# Patient Record
Sex: Female | Born: 1965 | Race: White | Hispanic: No | Marital: Married | Smoking: Never smoker
Health system: Southern US, Community
[De-identification: ages and names within clinical notes are randomized; demographics above are authoritative.]

## PROBLEM LIST (undated history)

## (undated) DIAGNOSIS — E079 Disorder of thyroid, unspecified: Secondary | ICD-10-CM

## (undated) DIAGNOSIS — F329 Major depressive disorder, single episode, unspecified: Secondary | ICD-10-CM

## (undated) DIAGNOSIS — F32A Depression, unspecified: Secondary | ICD-10-CM

## (undated) HISTORY — DX: Major depressive disorder, single episode, unspecified: F32.9

## (undated) HISTORY — PX: LEFT OOPHORECTOMY: SHX1961

## (undated) HISTORY — PX: TUBAL LIGATION: SHX77

## (undated) HISTORY — PX: ECTOPIC PREGNANCY SURGERY: SHX613

## (undated) HISTORY — DX: Depression, unspecified: F32.A

## (undated) HISTORY — DX: Disorder of thyroid, unspecified: E07.9

## (undated) HISTORY — PX: WISDOM TOOTH EXTRACTION: SHX21

---

## 2005-01-08 ENCOUNTER — Ambulatory Visit (HOSPITAL_COMMUNITY): Admission: RE | Admit: 2005-01-08 | Discharge: 2005-01-08 | Payer: Self-pay | Admitting: Gynecology

## 2005-07-19 ENCOUNTER — Ambulatory Visit: Payer: Self-pay | Admitting: Gynecology

## 2005-08-17 ENCOUNTER — Ambulatory Visit: Payer: Self-pay | Admitting: Family Medicine

## 2005-08-17 ENCOUNTER — Ambulatory Visit (HOSPITAL_COMMUNITY): Admission: RE | Admit: 2005-08-17 | Discharge: 2005-08-17 | Payer: Self-pay | Admitting: Gynecology

## 2005-09-14 ENCOUNTER — Ambulatory Visit: Payer: Self-pay | Admitting: Family Medicine

## 2005-09-30 ENCOUNTER — Ambulatory Visit: Payer: Self-pay | Admitting: Obstetrics & Gynecology

## 2005-10-18 ENCOUNTER — Ambulatory Visit: Payer: Self-pay | Admitting: Gynecology

## 2005-11-01 ENCOUNTER — Ambulatory Visit: Payer: Self-pay | Admitting: Gynecology

## 2005-11-15 ENCOUNTER — Ambulatory Visit: Payer: Self-pay | Admitting: Gynecology

## 2005-11-29 ENCOUNTER — Ambulatory Visit: Payer: Self-pay | Admitting: Gynecology

## 2005-12-06 ENCOUNTER — Ambulatory Visit: Payer: Self-pay | Admitting: Gynecology

## 2005-12-14 ENCOUNTER — Ambulatory Visit: Payer: Self-pay | Admitting: Family Medicine

## 2005-12-20 ENCOUNTER — Ambulatory Visit: Payer: Self-pay | Admitting: Gynecology

## 2005-12-27 ENCOUNTER — Ambulatory Visit: Payer: Self-pay | Admitting: Gynecology

## 2006-01-03 ENCOUNTER — Ambulatory Visit: Payer: Self-pay | Admitting: Gynecology

## 2006-01-03 ENCOUNTER — Inpatient Hospital Stay (HOSPITAL_COMMUNITY): Admission: AD | Admit: 2006-01-03 | Discharge: 2006-01-05 | Payer: Self-pay | Admitting: Gynecology

## 2006-03-07 ENCOUNTER — Ambulatory Visit: Payer: Self-pay | Admitting: Gynecology

## 2006-12-21 ENCOUNTER — Ambulatory Visit: Payer: Self-pay | Admitting: Family Medicine

## 2008-08-29 ENCOUNTER — Ambulatory Visit: Payer: Self-pay | Admitting: Internal Medicine

## 2009-01-07 ENCOUNTER — Ambulatory Visit: Payer: Self-pay

## 2010-03-16 ENCOUNTER — Ambulatory Visit: Payer: Self-pay | Admitting: Internal Medicine

## 2012-05-03 ENCOUNTER — Ambulatory Visit (INDEPENDENT_AMBULATORY_CARE_PROVIDER_SITE_OTHER): Payer: Self-pay | Admitting: Obstetrics & Gynecology

## 2012-05-03 ENCOUNTER — Encounter: Payer: Self-pay | Admitting: Obstetrics & Gynecology

## 2012-05-03 VITALS — BP 131/91 | HR 74 | Ht 70.0 in | Wt 255.0 lb

## 2012-05-03 DIAGNOSIS — N888 Other specified noninflammatory disorders of cervix uteri: Secondary | ICD-10-CM

## 2012-05-03 DIAGNOSIS — N72 Inflammatory disease of cervix uteri: Secondary | ICD-10-CM

## 2012-05-03 NOTE — Patient Instructions (Signed)
Return to clinic for any scheduled appointments or for any gynecologic concerns as needed.   

## 2012-05-03 NOTE — Progress Notes (Signed)
History:  47 y.o. [id number] here today after referral by her PCP for evaluation of cervical cysts and vaginal "lymph-node like" mass palpated during her recent physical exam. Patient denies any symptoms.  The pap smear done during that visit was negative.  The following portions of the patient's history were reviewed and updated as appropriate: allergies, current medications, past family history, past medical history, past social history, past surgical history and problem list.  Review of Systems:  Pertinent items are noted in HPI.  Objective:  Physical Exam BP 131/91  Pulse 74  Ht 5\' 10"  (1.778 m)  Wt 255 lb (115.667 kg)  BMI 36.59 kg/m2  LMP 04/16/2012 Gen: NAD Abd: Soft, nontender and nondistended Pelvic: Normal appearing external genitalia; normal appearing vaginal mucosa.  Two Nabothian cysts noted on posterior lip of cervix, normal appearance.  No abnormal masses palpated or visualized in vagina. Normal discharge.  Small uterus, no other palpable masses, no uterine or adnexal tenderness  Assessment & Plan:  Normal examination, patient reassured about benign nature of Nabothian cysts. Return to clinic for any gynecologic concerns as needed.

## 2012-05-24 ENCOUNTER — Ambulatory Visit: Payer: Self-pay

## 2013-03-25 ENCOUNTER — Inpatient Hospital Stay: Payer: Self-pay | Admitting: Internal Medicine

## 2013-03-25 ENCOUNTER — Ambulatory Visit: Payer: Self-pay | Admitting: Neurology

## 2013-03-25 LAB — URINALYSIS, COMPLETE
Blood: NEGATIVE
Glucose,UR: NEGATIVE mg/dL (ref 0–75)
Ketone: NEGATIVE
Specific Gravity: 1.003 (ref 1.003–1.030)
Squamous Epithelial: 1

## 2013-03-25 LAB — CBC
HGB: 12.9 g/dL (ref 12.0–16.0)
MCV: 81 fL (ref 80–100)
Platelet: 254 10*3/uL (ref 150–440)
RBC: 4.88 10*6/uL (ref 3.80–5.20)

## 2013-03-25 LAB — COMPREHENSIVE METABOLIC PANEL
Chloride: 105 mmol/L (ref 98–107)
Co2: 31 mmol/L (ref 21–32)
EGFR (Non-African Amer.): >60
Glucose: 100 mg/dL — ABNORMAL HIGH (ref 65–99)
Osmolality: 278 (ref 275–301)
SGOT(AST): 18 U/L (ref 15–37)
SGPT (ALT): 14 U/L (ref 12–78)
Total Protein: 7.7 g/dL (ref 6.4–8.2)

## 2013-03-26 LAB — CBC WITH DIFFERENTIAL/PLATELET
Eosinophil #: 0.2 10*3/uL (ref 0.0–0.7)
HGB: 13.1 g/dL (ref 12.0–16.0)
Lymphocyte %: 24.4 %
MCH: 27.1 pg (ref 26.0–34.0)
Monocyte #: 0.4 x10 3/mm (ref 0.2–0.9)
Neutrophil #: 6.6 10*3/uL — ABNORMAL HIGH (ref 1.4–6.5)
Neutrophil %: 68.8 %
Platelet: 257 10*3/uL (ref 150–440)
RBC: 4.83 10*6/uL (ref 3.80–5.20)
WBC: 9.6 10*3/uL (ref 3.6–11.0)

## 2013-03-26 LAB — LIPID PANEL
HDL Cholesterol: 44 mg/dL (ref 40–60)
Triglycerides: 102 mg/dL (ref 0–200)
VLDL Cholesterol, Calc: 20 mg/dL (ref 5–40)

## 2013-03-26 LAB — HEMOGLOBIN A1C: Hemoglobin A1C: 5.6 % (ref 4.2–6.3)

## 2013-03-26 LAB — COMPREHENSIVE METABOLIC PANEL
Albumin: 3 g/dL — ABNORMAL LOW (ref 3.4–5.0)
Alkaline Phosphatase: 69 U/L
Bilirubin,Total: 0.3 mg/dL (ref 0.2–1.0)
Calcium, Total: 8.7 mg/dL (ref 8.5–10.1)
Creatinine: 0.77 mg/dL (ref 0.60–1.30)
SGOT(AST): 17 U/L (ref 15–37)
SGPT (ALT): 14 U/L (ref 12–78)
Total Protein: 7.3 g/dL (ref 6.4–8.2)

## 2013-03-27 ENCOUNTER — Ambulatory Visit (HOSPITAL_COMMUNITY)
Admission: AD | Admit: 2013-03-27 | Discharge: 2013-03-27 | Disposition: A | Discharge status: Home / Self Care | Payer: Self-pay | Source: Other Acute Inpatient Hospital | Attending: Internal Medicine | Admitting: Internal Medicine

## 2013-05-12 ENCOUNTER — Inpatient Hospital Stay: Payer: Self-pay | Admitting: Internal Medicine

## 2013-05-12 LAB — COMPREHENSIVE METABOLIC PANEL
ALK PHOS: 63 U/L
Albumin: 3.4 g/dL (ref 3.4–5.0)
Anion Gap: 5 — ABNORMAL LOW (ref 7–16)
BILIRUBIN TOTAL: 0.5 mg/dL (ref 0.2–1.0)
BUN: 17 mg/dL (ref 7–18)
CREATININE: 0.84 mg/dL (ref 0.60–1.30)
Calcium, Total: 9.5 mg/dL (ref 8.5–10.1)
Chloride: 100 mmol/L (ref 98–107)
Co2: 26 mmol/L (ref 21–32)
GLUCOSE: 160 mg/dL — AB (ref 65–99)
Osmolality: 268 (ref 275–301)
POTASSIUM: 3.9 mmol/L (ref 3.5–5.1)
SGOT(AST): 20 U/L (ref 15–37)
SGPT (ALT): 14 U/L (ref 12–78)
Sodium: 131 mmol/L — ABNORMAL LOW (ref 136–145)
Total Protein: 8.3 g/dL — ABNORMAL HIGH (ref 6.4–8.2)

## 2013-05-12 LAB — HCG, QUANTITATIVE, PREGNANCY

## 2013-05-12 LAB — CBC
HCT: 38.8 % (ref 35.0–47.0)
HGB: 12.4 g/dL (ref 12.0–16.0)
MCH: 26.5 pg (ref 26.0–34.0)
MCHC: 32 g/dL (ref 32.0–36.0)
MCV: 83 fL (ref 80–100)
Platelet: 244 10*3/uL (ref 150–440)
RBC: 4.68 10*6/uL (ref 3.80–5.20)
RDW: 13.4 % (ref 11.5–14.5)
WBC: 14.3 10*3/uL — ABNORMAL HIGH (ref 3.6–11.0)

## 2013-05-12 LAB — TROPONIN I: Troponin-I: <0.02 ng/mL

## 2013-05-12 LAB — RAPID INFLUENZA A&B ANTIGENS

## 2013-05-13 LAB — CBC WITH DIFFERENTIAL/PLATELET
BASOS ABS: 0 10*3/uL (ref 0.0–0.1)
Basophil %: 0.3 %
EOS PCT: 1.8 %
Eosinophil #: 0.2 10*3/uL (ref 0.0–0.7)
HCT: 34.1 % — AB (ref 35.0–47.0)
HGB: 11.2 g/dL — ABNORMAL LOW (ref 12.0–16.0)
LYMPHS ABS: 1.5 10*3/uL (ref 1.0–3.6)
LYMPHS PCT: 18.4 %
MCH: 27 pg (ref 26.0–34.0)
MCHC: 32.7 g/dL (ref 32.0–36.0)
MCV: 82 fL (ref 80–100)
Monocyte #: 0.6 x10 3/mm (ref 0.2–0.9)
Monocyte %: 7.5 %
NEUTROS ABS: 6 10*3/uL (ref 1.4–6.5)
Neutrophil %: 72 %
Platelet: 219 10*3/uL (ref 150–440)
RBC: 4.14 10*6/uL (ref 3.80–5.20)
RDW: 13.8 % (ref 11.5–14.5)
WBC: 8.4 10*3/uL (ref 3.6–11.0)

## 2013-05-13 LAB — BASIC METABOLIC PANEL
Anion Gap: 3 — ABNORMAL LOW (ref 7–16)
BUN: 11 mg/dL (ref 7–18)
CALCIUM: 8.4 mg/dL — AB (ref 8.5–10.1)
CHLORIDE: 105 mmol/L (ref 98–107)
CO2: 28 mmol/L (ref 21–32)
CREATININE: 0.86 mg/dL (ref 0.60–1.30)
EGFR (African American): >60
Glucose: 108 mg/dL — ABNORMAL HIGH (ref 65–99)
OSMOLALITY: 272 (ref 275–301)
Potassium: 3.5 mmol/L (ref 3.5–5.1)
SODIUM: 136 mmol/L (ref 136–145)

## 2013-05-13 LAB — LIPID PANEL
Cholesterol: 86 mg/dL (ref 0–200)
HDL Cholesterol: 51 mg/dL (ref 40–60)
LDL CHOLESTEROL, CALC: 24 mg/dL (ref 0–100)
Triglycerides: 54 mg/dL (ref 0–200)
VLDL Cholesterol, Calc: 11 mg/dL (ref 5–40)

## 2013-05-13 LAB — HEMOGLOBIN A1C: Hemoglobin A1C: 5.4 % (ref 4.2–6.3)

## 2013-05-13 LAB — TSH: THYROID STIMULATING HORM: 0.56 u[IU]/mL

## 2013-05-14 LAB — CBC WITH DIFFERENTIAL/PLATELET
BASOS PCT: 0.4 %
Basophil #: 0 10*3/uL (ref 0.0–0.1)
EOS ABS: 0.2 10*3/uL (ref 0.0–0.7)
EOS PCT: 2 %
HCT: 36.1 % (ref 35.0–47.0)
HGB: 11.8 g/dL — AB (ref 12.0–16.0)
LYMPHS ABS: 1.6 10*3/uL (ref 1.0–3.6)
LYMPHS PCT: 15.1 %
MCH: 27.2 pg (ref 26.0–34.0)
MCHC: 32.7 g/dL (ref 32.0–36.0)
MCV: 83 fL (ref 80–100)
MONO ABS: 0.7 x10 3/mm (ref 0.2–0.9)
Monocyte %: 6.7 %
Neutrophil #: 7.9 10*3/uL — ABNORMAL HIGH (ref 1.4–6.5)
Neutrophil %: 75.8 %
Platelet: 245 10*3/uL (ref 150–440)
RBC: 4.35 10*6/uL (ref 3.80–5.20)
RDW: 13.4 % (ref 11.5–14.5)
WBC: 10.4 10*3/uL (ref 3.6–11.0)

## 2013-05-14 LAB — BASIC METABOLIC PANEL
Anion Gap: 3 — ABNORMAL LOW (ref 7–16)
BUN: 9 mg/dL (ref 7–18)
CHLORIDE: 101 mmol/L (ref 98–107)
CO2: 30 mmol/L (ref 21–32)
Calcium, Total: 8.7 mg/dL (ref 8.5–10.1)
Creatinine: 0.89 mg/dL (ref 0.60–1.30)
GLUCOSE: 115 mg/dL — AB (ref 65–99)
OSMOLALITY: 268 (ref 275–301)
Potassium: 3.5 mmol/L (ref 3.5–5.1)
SODIUM: 134 mmol/L — AB (ref 136–145)

## 2013-05-14 LAB — TROPONIN I: Troponin-I: <0.02 ng/mL

## 2013-05-14 LAB — VANCOMYCIN, TROUGH: Vancomycin, Trough: 13 ug/mL (ref 10–20)

## 2013-05-15 LAB — VANCOMYCIN, TROUGH: Vancomycin, Trough: 12 ug/mL (ref 10–20)

## 2013-05-16 LAB — VANCOMYCIN, TROUGH: VANCOMYCIN, TROUGH: 11 ug/mL (ref 10–20)

## 2013-05-17 LAB — CULTURE, BLOOD (SINGLE)

## 2013-05-31 ENCOUNTER — Ambulatory Visit: Payer: Self-pay | Admitting: Internal Medicine

## 2014-02-11 ENCOUNTER — Encounter: Payer: Self-pay | Admitting: Obstetrics & Gynecology

## 2014-08-02 NOTE — Discharge Summary (Signed)
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 The patient does have right-sided weakness and dysarthria.   CURRENT MEDICATIONS: Include heparin drip, Tylenol 650 mg every 4 hours as needed for pain, aspirin 81 mg daily, NovoLog sliding scale, Ativan 0.5 mg every 4 hours as needed for anxiety, Zofran 4 mg IV push q.4 hours p.Aguirre.n. nausea and vomiting, Lipitor 20 mg at bedtime.   HOSPITAL COURSE: The patient was admitted December14, 2014, and discharged March 26, 2013, to [facility]. May end up going on December 16. The patient came in with slurred speech, weakness on the right side, admitted with presumed stroke.   LABORATORY AND RADIOLOGICAL DATA DURING THE HOSPITAL COURSE: Included an EKG that showed normal sinus rhythm, no acute ST-T-wave changes.   CHEST X-RAY: No active disease.   CT scan of the head: Area of chronic lacunar infarction within the left frontal lobe. Small-vessel white matter ischemic changes, focal area of high attenuation within the basal artery.   PTT 34.8. Glucose 100, BUN 15, creatinine 0.84, sodium 139, potassium 3.7, chloride 105, CO2 31, calcium 8.6. Liver function tests normal range. Albumin low at 3.2. White blood cell count 7.3, H and H 12.9 and 39.5, platelet count of 254. Beta-2 glycoprotein pending. Homocystine pending. Anticardiolipin antibodies pending.   Urinalysis: Trace leukocyte esterase. Hemoglobin A1c 5.6. LDL 75, HDL 44, triglycerides 102.   MRA of the brain without contrast showed some irregularity of the appearance of the right middle cerebral artery at the bifurcation region. Could suggest presence of a nonocclusive thrombus.   MRI of the brain with and without contrast showed scattered areas of acute infarction of  the left middle cerebral artery territory affecting the insular deep white matter and parietal cortical and subcortical brain consistent with MCA territory embolic infarctions.   Echocardiogram showed EF 50% to 55%, moderate-sized fixed mass attached to the fossa ovalis, atrial septum, and of left atrium.   Carotid ultrasound: No evidence of focal hemodynamically significant carotid stenosis.   The patient was seen in consultation by Dr. Raynelle Charyobert Hendry, neurology; and then Dr. Malvin JohnsPotter, following; Dr. Lady GaryFath, cardiology.   HOSPITAL COURSE: The patient was initially started on aspirin for stroke-like symptoms since the patient does have embolic CVA and a mass in the atrium of the heart. Dr. Malvin JohnsPotter did recommend anticoagulation since the patient could be an operative candidate with a myxoma, and they recommended heparin instead of Coumadin at this time. Heparin drip was started without a bolus. Further management by the specialists team including cardiothoracic surgery, neurology, and cardiology will be needed at [facility].   The patient was started on Lipitor, anticardiolipin antibodies pending, antithrombin III pending, beta-2 glycoprotein pending, factor II mutation analysis pending, factor V Leiden pending, and the lupus anticoagulant pending. Protein S and C panel also pending.   TIME SPENT ON PATIENT CARE TODAY: 60 minutes.    ____________________________ Herschell Dimesichard J. Renae GlossWieting, MD rjw:np D: 03/26/2013 16:16:04 ET T: 03/26/2013 16:37:47 ET JOB#: [id number]  cc: Herschell Dimesichard J. Renae GlossWieting, MD, <Dictator> Hyman HopesHarriett P. Burns, MD Dr. __________, [facility] Salley ScarletICHARD J Pablo Stauffer MD ELECTRONICALLY SIGNED 03/30/2013 17:00

## 2014-08-02 NOTE — Discharge Summary (Signed)
PATIENT NAME:  Misty Aguirre, Misty Aguirre MR#:  [id number] DATE OF BIRTH:  Apr 18, 1965  DATE OF ADMISSION:  03/25/2013 DATE OF DISCHARGE:  03/26/2013  ADDENDUM  I just got a call from Dr. Lady GaryFath who [facility]. They decided against the heparin drip at this point in time. The patient will just go over on aspirin and will be further evaluated there. ____________________________ Herschell Dimesichard J. Renae GlossWieting, MD rjw:sb D: 03/26/2013 16:19:05 ET T: 03/26/2013 16:32:45 ET JOB#: [id number]  cc: Herschell Dimesichard J. Renae GlossWieting, MD, <Dictator> Salley ScarletICHARD J Winnie Barsky MD ELECTRONICALLY SIGNED 03/30/2013 17:00

## 2014-08-02 NOTE — Consult Note (Signed)
Brief Consult Note: Diagnosis: right arm and leg weakness with dysarthria.   Patient was seen by consultant.   Recommend further assessment or treatment.   Discussed with Attending MD.   Comments: 49 yo female with no prior cardiac or medical history who was admitted after developing acute onset of right sided weakness and dysarthria. Head ct revealed frontal lacunal infarct. MRI revealed normal left mca and possible embolus in the right mca birfurcation region. 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 Her neuro status is improved somewhat but still has slightly slurred speach and right body weakness. Will discuss with neurology cardiac surgery.  Electronic Signatures: Dalia HeadingFath, Veleda Mun A (MD)  (Signed 15-Dec-14 14:17)  Authored: Brief Consult Note   Last Updated: 15-Dec-14 14:17 by Dalia HeadingFath, Leon Montoya A (MD)

## 2014-08-02 NOTE — Consult Note (Signed)
   Present Illness 49 yo female with history of thyroid nodules but no other medical history who was admitted after suddenly falling out of a chair with initial dysphagia followed by dysarthria and right body weakness. She had a brain ct in the er which revealed left frontal lacunar infarct. Brain MRI revealed no signifnicant l mca lesions Questionalble right mca embolus. Carotid dopplers were unremarkable. 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 LV funciton appeared normal. Mild mr and tr.Pts neurologic symtpoms have been improving. Pt denies family history of cad and denies dm, hypertension and  tobacco abuse.   Physical Exam:  GEN well nourished   HEENT hearing intact to voice   NECK No masses   RESP clear BS  no use of accessory muscles   CARD Regular rate and rhythm  Normal, S1, S2  No murmur   ABD denies tenderness  no hernia  normal BS  no Abdominal Bruits   LYMPH negative neck   EXTR negative cyanosis/clubbing, negative edema   SKIN normal to palpation   NEURO R side weakness, dysarthric   PSYCH A+O to time, place, person, good insight   Review of Systems:  Subjective/Chief Complaint difficulty speaking and right body weakness, lower extremety more then the upper   General: Weakness   Skin: No Complaints   ENT: No Complaints   Eyes: No Complaints   Neck: No Complaints   Respiratory: No Complaints   Cardiovascular: No Complaints   Gastrointestinal: No Complaints   Genitourinary: No Complaints   Vascular: No Complaints   Musculoskeletal: No Complaints   Neurologic: right body weakness   Hematologic: No Complaints   Endocrine: No Complaints   Psychiatric: No Complaints   Review of Systems: All other systems were reviewed and found to be negative   Medications/Allergies Reviewed Medications/Allergies reviewed   EKG:  EKG Nml  NSR    Penicillin: Other   Impression 49 yo female admitted with  right body cva. Etiology is likely secondary to embolic events from left atrial myxoma. Will need consideration for surgical removal. Will hold on use of anticoagulaiotn for now as these events appear to be embollic from a myxoma and not a thrombotic event. [facility].   Plan 1. Continue current meds. 2. 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

## 2014-08-02 NOTE — Consult Note (Signed)
Referring Physician:  Gladstone Lighter :   Primary Care Physician:  BURNS, HARRIET [phone]  Reason for Consult: Admit Date: 25-Mar-2013  Chief Complaint: stroke  Reason for Consult: CVA; seizure   History of Present Illness: History of Present Illness:   49 year old woman with minimal medical history presented to [facility] ED with abrupt onset expressive aphasia and imbalance.  She initially fell out of a chair.  She noticed that her legs "were not working right".  She had difficulty ambulating.  She noticed weakness in the RUE as well.  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  She then had a brain MRI that showed left MCA distribution multifocal lacunar acute infarctions.  Expressive aphasia has improved somewhat since admission.  Symptoms are still moderate since her speech is still impaired and she feels imbalance and thus unable to ambulate properly.  No clear provoking factors.   MEDICAL HISTORY: Thyroid nodules.  Recent sinusitis.  SURGICAL HISTORY:due to ectopic pregnancyligation HISTORY: patient is married. history of alcohol or tobacco abuse.  HISTORY: coronary artery disease and stroke.  MEDICATIONS:   PENICILLIN.    ROS:  General denies complaints   HEENT no complaints   Lungs no complaints   Cardiac no complaints   GI no complaints   GU no complaints   Musculoskeletal no complaints   Extremities no complaints   Skin no complaints   Endocrine no complaints   Psych no complaints   Past Medical/Surgical Hx:  ectopic pregnancy:   denies medical history:   oopharectomy:     [facility] Current Meds:   Aspirin Enteric Coated tablet, ( Ecotrin)  81 mg Oral daily  - Indication: Pain/Fever/Thromboembolic Disorders/Post MI/Prophylaxis MI  Instructions:  Initiate Bleeding Precautions Protocol--DO NOT CRUSH  Acetaminophen * tablet,  ( Tylenol (325 mg) tablet)  325 to 650 mg Oral q4h PRN for pain or temp. greater  than 100.4  - Indication: Pain/Fever  LORazepam tablet,  ( Ativan)  0.5 mg Oral q4h PRN for anxiety  - Indication: Anxiety/ Seizure/ Antiemetic Adjunct/ Preop Sedation  Ondansetron injection,  ( Zofran injection )  4 mg, IV push, q4h PRN for nausea, vomiting  Indication: Nausea/ Vomiting  Insulin SS -Novolog injection, Subcutaneous, FSBS before meals and at bedtime  0 unit(s) if FSBS 0 - 200     4 unit(s) if FSBS 201 - 250     6 unit(s) if FSBS 251 - 300     8 unit(s) if FSBS 301 - 350     10 unit(s) if FSBS 351 - 400  Call MD if FSBS greater than 400, [Waste Code: Black]  Nursing Saline Flush, 3 to 6 ml, IV push, Q1M PRN for IV Maintenance  atorvaSTATin tablet,  ( Lipitor)  20 mg Oral at bedtime  - Indication: Hypercholesterolemia  Enoxaparin injection,  ( Lovenox injection )  40 mg, Subcutaneous, daily  Indication: Prophylaxis or treatment of thromboembolic disorders, Monitor Anticoags per hospital protocol  oxyCODONE tablet (Immediate Release), ( Roxicodone)  5 mg Oral q4h PRN for pain  - Indication: Pain  Instructions:  [Med Admin Window: 30 mins before or after scheduled dose]  Allergies:  Penicillin: Other  Vital Signs: **Vital Signs.:   15-Dec-14 13:05  Vital Signs Type Routine  Temperature Temperature (F) 97.7  Celsius 36.5  Temperature Source oral  Pulse Pulse 82  Respirations Respirations 18  Systolic BP Systolic BP 825  Diastolic BP (mmHg)  Diastolic BP (mmHg) 85  Mean BP 99  Pulse Ox % Pulse Ox % 97  Pulse Ox Activity Level  At rest  Oxygen Delivery Room Air/ 21 %   EXAM: GENERAL: Pleasant woman.  In NAD.  Normocephalic and atraumatic.  EYES: Funduscopic exam shows normal disc size, appearance and C/D ratio without clear evidence of papilledema.  CARDIOVASCULAR: S1 and S2 sounds are within normal limits, without murmurs, gallops, or rubs.  MUSCULOSKELETAL: Bulk - Normal Tone - Normal Pronator Drift - Present in the RUE. Ambulation - Gait and  station are moderately unsteady.  R/L 5/5    Shoulder abduction (deltoid/supraspinatus, axillary/suprascapular n, C5) 5/5    Elbow flexion (biceps brachii, musculoskeletal n, C5-6) 5/5    Elbow extension (triceps, radial n, C7) 5/5    Finger adduction (interossei, ulnar n, T1)   5/5    Hip flexion (iliopsoas, L1/L2) 5/5    Knee flexion (hamstrings, sciatic n, L5/S1) 5/5    Knee extension (quadriceps, femoral n, L3/4) 5/5    Ankle dorsiflexion (tibialis anterior, deep fibular n, L4/5) 5/5    Ankle plantarflexion (gastroc, tibial n, S1)  NEUROLOGICAL: MENTAL STATUS: Patient is oriented to person, place and time.  Recent and remote memory are intact.  Attention span and concentration are intact.  Naming, repetition, comprehension and expressive speech are within normal limits.  Patient's fund of knowledge is within normal limits for educational level.  CRANIAL NERVES: Normal    CN II (normal visual acuity and visual fields) Normal    CN III, IV, VI (extraocular muscles are intact) Normal    CN V (facial sensation is intact bilaterally) Normal    CN VII (facial strength is intact bilaterally) Normal    CN VIII (hearing is intact bilaterally) Normal    CN IX/X (palate elevates midline, normal phonation) Normal    CN XI (shoulder shrug strength is normal and symmetric) Normal    CN XII (tongue protrudes midline)   SENSATION: Intact to pain and temp bilaterally (spinothalamic tracts) Intact to position and vibration bilaterally (dorsal columns)   REFLEXES: R/L 2+/2+    Biceps 2+/2+    Brachioradialis   2+/2+    Patellar 2+/2+    Achilles   COORDINATION/CEREBELLAR: Finger to nose testing is within normal limits..  Lab Results:  Thyroid:  15-Dec-14 04:18   Thyroid Stimulating Hormone 2.24 (0.45-4.50 (International Unit)  ----------------------- Pregnant patients have  different reference  ranges for TSH:  - - - - - - - - - -  Pregnant, first trimetser:  0.36 - 2.50  uIU/mL)  LabObservation:  15-Dec-14 11:40   OBSERVATION Reason for Test  Hepatic:  15-Dec-14 04:18   Bilirubin, Total 0.3  Alkaline Phosphatase 69 (45-117 NOTE: New Reference Range 03/02/13)  SGPT (ALT) 14  SGOT (AST) 17  Total Protein, Serum 7.3  Albumin, Serum  3.0  General Ref:  14-Dec-14 15:31   Anticardiolipin ABS IgG IgM ========== TEST NAME ==========  ========= RESULTS =========  = REFERENCE RANGE =  ANTICARDIOLPN AB IGG,IGM  Anticardiolipin Ab, IgG/M, Qn Anticardiolipin Ab,IgG,Qn       [   Result Pending       ]                   Anticardiolipin Ab,IgM,Qn   [   Result Pending       ]  [facility]            No: [id number]           [address], [place], [place] [address]           Lindon Romp, MD         [phone]   Result(s) reported on 26 Mar 2013 at 12:47PM.  Homocysteine, Serum / Plasma ========== TEST NAME ==========  ========= RESULTS =========  = REFERENCE RANGE =  HOMOCYSTEINE, SERUM  Homocyst(e)ine, Plasma Homocyst(e)ine, Plasma          [   8.5 umol/L           ]          0.0-15.0               [facility]     No: [id number]           [address], [place],  [address]           Lindon Romp, MD         [phone]   Result(s) reported on 26 Mar 2013 at 12:47PM.  Beta 2 Glycoprotein ========== TEST NAME ==========  ========= RESULTS =========  = REFERENCE RANGE =  BETA2 GLYCOPROTEIN  Beta-2 Glycoprotein I Ab,G,A,M Beta-2 Glycoprotein I Ab, IgG   [   Result Pending       ]                   Beta-2 Glycoprotein I Ab, IgA   [  Result Pending       ]                   Beta-2 Glycoprotein I Theodis Shove   [   Result Pending       ]                                 [facility]             No: [id number]           [address] [place],  [address]           Lindon Romp, MD         [phone]   Result(s) reported on 26 Mar 2013 at 12:47PM.  Cardiology:   14-Dec-14 10:14   Ventricular Rate 78  Atrial Rate 78  P-R Interval 132  QRS Duration 88  QT 378  QTc 430  P Axis 3  R Axis 22  T Axis 20  ECG interpretation Normal sinus rhythm Normal ECG No previous ECGs available ----------unconfirmed---------- Confirmed by OVERREAD, NOT ([id number]), editor PEARSON, BARBARA ([id number]) on 03/26/2013 10:19:40 AM  15-Dec-14 11:40   Echo Doppler REASON FOR EXAM:     COMMENTS:     PROCEDURE: [place] - ECHO DOPPLER COMPLETE(TRANSTHOR)  - Mar 26 2013 11:40AM   RESULT: Echocardiogram Report  Patient Name:   Misty Aguirre Date of Exam: 03/26/2013 Medical Rec #:  [id number]        Custom1: Date of Birth:  09-10-1965     Height:       72.0 in Patient Age:    46 years      Weight:       243.0 lb Patient Gender: F             BSA:  2.79 m??  Indications: CVA Sonographer:    Sherrie Sport RDCS Referring Phys: Gladstone Lighter  Summary: After ruling out all contraindications,The image enhancement agent  Definity was used for this study.  1. Left ventricular ejection fraction, by visual estimation, is 55 to  60%.  2. Moderate sized fixed mass attached to the fossa ovalis/atrial septum  of the left atrium. 2D AND M-MODE MEASUREMENTS (normal ranges within parentheses): Left Ventricle:          Normal IVSd (2D):      1.05 cm (0.7-1.1) LVPWd (2D):     1.09 cm (0.7-1.1) Aorta/LA:                  Normal LVIDd (2D):     4.35 cm (3.4-5.7) Aortic Root (2D): 2.60 cm (2.4-3.7) LVIDs (2D):     2.99 cm           Left Atrium (2D): 3.30 cm (1.9-4.0) LV FS (2D):     31.3 %   (>25%) LV EF (2D):     59.3 %   (>50%)                                   Right Ventricle:                         RVd (2D):        0.17 cm LV DIASTOLIC FUNCTION: MV Peak E: 0.83 m/s E/e' Ratio: 9.80 MV Peak A: 0.72 m/s Decel Time: 227 msec E/A Ratio: 1.16 SPECTRAL DOPPLER ANALYSIS (where applicable): Mitral Valve: MV P1/2 Time: 65.83 msec MV Area, PHT: 3.34 cm?? Aortic Valve: AoV Max Vel: 1.16  m/s AoV Peak PG: 5.4 mmHg AoV Mean PG: LVOT Vmax: 0.91 m/s LVOT VTI:  LVOT Diameter: 2.10 cm AoV Area, Vmax: 2.71 cm?? AoV Area, VTI:  AoV Area, Vmn: Tricuspid Valve and PA/RV SystolicPressure: TR Max Velocity: 1.53 m/s RA  Pressure: 5 mmHg RVSP/PASP: 14.4 mmHg Pulmonic Valve: PV Max Velocity: 0.81 m/s PV Max PG: 2.7 mmHg PV Mean PG:  PHYSICIAN INTERPRETATION: Left Ventricle: The left ventricular internal cavity size was normal. LV  posterior wall thickness was normal. Left ventricular ejection fraction,  by visual estimation, is 55 to 60%. Left Atrium: The left atrium is normal in size. There is a moderate sized  fixed mass seen attached to the fossa ovalis/atrial septum. The LA mass  appears to be attached to the fossa ovalis/atrial septum. The mass is  pedunculated in shape. Mitral Valve: The mitral valve is not well seen. Trace mitral valve  regurgitation is seen. Tricuspid Valve: The tricuspid valve is normal. The tricuspid regurgitant  velocity is 1.53 m/s, and with an assumed right atrial pressure of 5  mmHg, the estimated right ventricular systolic pressure is normal at 14.4  mmHg. Shunts: Agitated saline contrast was given intravenously to evaluate for  intracardiac shunting.  [place] MD Electronically signed by [id number] Bill MD Signature Date/Time: 03/26/2013/12:40:21 PM  *** Final ***  IMPRESSION: .  Verified By: Teodoro Spray, M.D., MD  Routine Chem:  15-Dec-14 04:18   Cholesterol, Serum 139  Triglycerides, Serum 102  HDL (INHOUSE) 44  VLDL Cholesterol Calculated 20  LDL Cholesterol Calculated 75 (Result(s) reported on 26 Mar 2013 at 05:18AM.)  Hemoglobin A1c [facility]) 5.6 (The American Diabetes Association recommends that a primary goal of therapy should be <7% and that  physicians should reevaluate the treatment regimen in patients with HbA1c values consistently >8%.)  Glucose, Serum 98  BUN 11  Creatinine (comp) 0.77  Sodium, Serum 140   Potassium, Serum 3.7  Chloride, Serum  108  CO2, Serum 28  Calcium (Total), Serum 8.7  Osmolality (calc) 279  eGFR (African American) >60  eGFR (Non-African American) >60 (eGFR values <74m/min/1.73 m2 may be an indication of chronic kidney disease (CKD). Calculated eGFR is useful in patients with stable renal function. The eGFR calculation will not be reliable in acutely ill patients when serum creatinine is changing rapidly. It is not useful in  patients on dialysis. The eGFR calculation may not be applicable to patients at the low and high extremes of body sizes, pregnant women, and vegetarians.)  Anion Gap  4  Routine UA:  14-Dec-14 19:13   Color (UA) Colorless  Clarity (UA) Clear  Glucose (UA) Negative  Bilirubin (UA) Negative  Ketones (UA) Negative  Specific Gravity (UA) 1.003  Blood (UA) Negative  pH (UA) 7.0  Protein (UA) Negative  Nitrite (UA) Negative  Leukocyte Esterase (UA) Trace (Result(s) reported on 25 Mar 2013 at 07:33PM.)  RBC (UA) <1 /HPF  WBC (UA) 1 /HPF  Bacteria (UA) NONE SEEN  Epithelial Cells (UA) 1 /HPF  Mucous (UA) PRESENT (Result(s) reported on 25 Mar 2013 at 07:33PM.)  Routine Coag:  14-Dec-14 10:45   Activated PTT (APTT) 34.8 (A HCT value >55% may artifactually increase the APTT. In one study, the increase was an average of 19%. Reference: "Effect on Routine and Special Coagulation Testing Values of Citrate Anticoagulant Adjustment in Patients with High HCT Values." American Journal of Clinical Pathology 2006;126:400-405.)  Routine Hem:  15-Dec-14 04:18   WBC (CBC) 9.6  RBC (CBC) 4.83  Hemoglobin (CBC) 13.1  Hematocrit (CBC) 39.8  Platelet Count (CBC) 257  MCV 82  MCH 27.1  MCHC 32.9  RDW 12.9  Neutrophil % 68.8  Lymphocyte % 24.4  Monocyte % 4.4  Eosinophil % 1.9  Basophil % 0.5  Neutrophil #  6.6  Lymphocyte # 2.3  Monocyte # 0.4  Eosinophil # 0.2  Basophil # 0.0 (Result(s) reported on 26 Mar 2013 at 05:15AM.)   Radiology  Results: [place]    15-Dec-14 11:51, [place] Doppler Bilateral  [place] Bilateral   REASON FOR EXAM:    CVA  COMMENTS:       PROCEDURE: [place] - [place]OTID DOPPLER BILATERAL  - Mar 26 2013 11:51AM     CLINICAL DATA:  CVA    EXAM:  BILATERAL CAROTID DUPLEX ULTRASOUND    TECHNIQUE:  GPearline Cablesscale imaging, color Doppler and duplex ultrasoundwere  performed of bilateral carotid and vertebral arteries in the neck.    COMPARISON:  None.  FINDINGS:  Criteria: Quantification of carotid stenosis is based on velocity  parameters that correlate the residual internal carotid diameter  with NASCET-based stenosis levels, using the diameter of the distal  internal carotid lumen as the denominator for stenosis measurement.    The following velocity measurements were obtained:    RIGHT    ICA:  98/42 cm/sec    CCA:  735/46cm/sec    SYSTOLIC ICA/CCA RATIO:  1.2  DIASTOLIC ICA/CCA RATIO:  1.9    ECA:  91 cm/sec    LEFT    ICA:  69/28 cm/sec    CCA:  1568/12cm/sec    SYSTOLIC ICA/CCA RATIO:  0.7    DIASTOLIC ICA/CCA RATIO:  1.0  ECA:  89 cm/sec  RIGHT CAROTID ARTERY: The grayscale images on the right show no  significant atherosclerotic plaque. The waveforms, velocities and  flow velocity ratios reveal no evidence of focal hemodynamically  significant stenosis.    RIGHT VERTEBRAL ARTERY:  Antegrade    LEFT CAROTID ARTERY: The grayscale images demonstrate no significant  atherosclerotic plaque within the left internal carotid artery. The  waveforms, velocities and flow velocity ratios show no evidence of  focal hemodynamically significant stenosis.    LEFT VERTEBRAL ARTERY:  Antegrade     IMPRESSION:  No evidence of focal hemodynamically significant carotid stenosis.      Electronically Signed    By: Inez Catalina M.D.    On: 03/26/2013 13:49         Verified By: Everlene Farrier, M.D.,  CT:    14-Dec-14 10:36, CT Head Without Contrast  CT Head Without  Contrast   REASON FOR EXAM:    expressive aphasia  COMMENTS:   May transport without cardiac monitor    PROCEDURE: CT  - CT HEAD WITHOUT CONTRAST  - Mar 25 2013 10:36AM     CLINICAL DATA:  Expressive  aphasia    EXAM:  CT HEAD WITHOUT CONTRAST    TECHNIQUE:  Contiguous axial images were obtained from the base of the skull  through the vertex without intravenous contrast.    COMPARISON:  None.  FINDINGS:  Focal area of low attenuation projects within the posterior aspect  of the left frontal lobe. There is otherwise no evidence of  intra-axial or extra-axial fluid collections no evidence of acute  hemorrhage. The ventricles and cisterns are patent. There is no  evidence of mass effect. The cerebellum pons and basal ganglia  regions are unremarkable. Verymild scattered areas of low  attenuation project within the deep white matter regions.    Areas of mucosal thickening appreciated within the ethmoid air cells  and sphenoid sinus on the left. The mastoid air cells are patent.    Evaluation of basilarartery image number 6 series 2 demonstrates  increased attenuation within the vessel. There are areas of beam  hardening within the surrounding tissues. This finding may represent  sequela of artifact though if clinically appropriate thrombus within  the basilar artery cannot be excluded. If clinically warranted  further evaluation with MRI to include MRA is recommended.     IMPRESSION:  1. 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  2. There is no evidence of acute intracranial abnormalities.  3. Small vessel white matter ischemic changes.  4. Focal area of high attenuation within the basilar artery  described above differential considerations are artifact due to beam  hardening versus an area of thrombus if clinically appropriate. If  clinically warranted further evaluation with MRI including MRA is  recommended.    Electronically Signed    By: Margaree Mackintosh M.D.    On: 03/25/2013 10:52         Verified By: Mikki Santee, M.D., MD   Impression/Recommendations: Recommendations:   49 year old woman with minimal medical history presented to [facility] ED with abrupt onset expressive aphasia and imbalance.  have personally viewed brain MRI, shows multifocal acute lacunar infarction in the left MCA distribution.  This is concerning for embolic etiology, in particular cardioembolic.  MRA shows tortuosity in the left MCA, but no definite stenosis.  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  Cardiac myxoma often presents with stroke symptoms.  Since infarctions are small, since over 24 hours out from initial event it would be reasonable to start patient on oral anticoagulation without lovenox bridge.  Alternatively, if patient is going to be transferred to [facility] for possible surgery, then I would defer to them about whether or not to start this medicaiton, as I would not want to postpone her potential surgery because of starting the oral anticoagulant.  Would recommend close discussion with cardiology regarding their findings and plans for the cardiac fixed mass lesion.  No history of afib.  Carotid ultrasound is unremarkable.   STROKEContinue frequent neuro checks (Q4h) for first 24 hours and reassess frequency after that time.Brain MRI/A -results as above.Carotid doppler - negative for carotid stenosisTTE - shows cardiac mass in atriumMonitor on telemetry to eval for afibCheck fasting lipid panel, TSH and HgbA1cCycle cardiac enzymes Cardiac monitoring as described above.Consider oral anticoagulation as above if no surgery imminent, if surgery imminent or possible then start baby aspirin after first running by the surgical team to ensure this will not interfere with their plans. TREATMENT:Per ATP III guidlines, given that patient has additional stroke risk factors, start or increase statin if LDL >  70 PRESSURE CONTROL:Allow permissive HTN for BP less than 220/110Restart home antihypertensive medications prior to discharge. PT, OT, Speech evaluationsConsult speech therapy for swallowing assessment and stroke education.Strict NPO until cleared by speech therapy.Fall PrecautionsCounseling against using tobacco products or smoking.DVT prophylaxis with [place] lovenox.  have reviewed the results of the most recent imaging studies, tests and labs as outlined above and answered all related questions.  viewed as above. and coordinated plan of care with hospitalist, Dr. Leslye Peer.   Electronic Signatures: Anabel Bene (MD)  (Signed 16-Dec-14 10:49)  Authored: REFERRING PHYSICIAN, Primary Care Physician, Consult, History of Present Illness, Review of Systems, PAST MEDICAL/SURGICAL HISTORY, HOME MEDICATIONS, Current Medications, ALLERGIES, NURSING VITAL SIGNS, Physical Exam-, LAB RESULTS, RADIOLOGY RESULTS, Recommendations   Last Updated: 16-Dec-14 10:49 by Anabel Bene (MD)

## 2014-08-02 NOTE — Consult Note (Signed)
Reason for Consult: Admit Date: 25-Mar-2013  Chief Complaint: "Couldn't say a word"  Reason for Consult: CVA   History of Present Illness: History of Present Illness:   Misty Aguirre is a 49 yo right-handed woman with a history of thyroid nodules and no other significant medical history who presents to [facility] after developing sudden onset expressive aphasia earlier today. She was in her usual state of health at 0900 when she developed vertigo and suddenly found herself unable to speak. The vertigo quickly resolved, but she continued to be completely unable to speak. She was able to understand language with no difficulties. After about 5 minutes, this began improving such that she could utter some words with great effort. She denies tinnitus, diplopia, dysphagia, weakness, numbness, or visual deficits. She presented to [facility] for further evaluation. In the ED, she continued to have some fluctuation in the severity of her aphasia but overall is has improved on average. A noncontrast head CT showed evidence of a chronic L frontal lacunar-appearing infarct with no other acute intracranial process. The brainstem and posterior fossa were partially obscured by artifact. She was admitted for further evaluation and management.  ROS:  Review of Systems   As per HPI. In addition, the patient denies fevers, chills, visual changes, chest discomfort, palpitations, cough, shortness of breath, nausea, vomiting, diarrhea, joint tenderness, or rashes. The remainder of a full 12-point review of systems is negative.   Past Medical/Surgical Hx:  ectopic pregnancy:   denies medical history:   oopharectomy:   Past Medical/ Surgical Hx:  Past Surgical History oopherectomy due to ectopic pregnancy tubal ligation   Home Medications: Additional Home Medications: None   Allergies:  Penicillin: Other  Social/Family History: Employment Status: homemaker  Lives With: children; spouse  Social History: Denies smoking,  EtOH, or illicit drug use.  Family History: Extensive family history of strokes. Mother had several strokes starting in her 10s, and maternal grandmother also had multiple strokes. Older brother died of MI at age 21.   Vital Signs: **Vital Signs.:   14-Dec-14 12:54  Vital Signs Type Admission  Temperature Temperature (F) 97.6  Celsius 36.4  Temperature Source oral  Pulse Pulse 74  Respirations Respirations 20  Systolic BP Systolic BP 353  Diastolic BP (mmHg) Diastolic BP (mmHg) 84  Mean BP 100  Pulse Ox % Pulse Ox % 99  Pulse Ox Activity Level  At rest  Oxygen Delivery Room Air/ 21 %   EXAM: Well-developed, well-nourished, in NAD. No conjunctival injection or scleral edema. Oropharynx clear. No carotid bruits auscultated. Normal S1, S2 and regular cardiac rhythm on exam. Lungs clear to auscultation bilaterally. Abdomen soft and nontender. Peripheral pulses palpated. No clubbing, cyanosis, or edema in extremities.  NEUROLOGIC EXAM: MENTAL STATUS: Alert and oriented to person, place, and time. Language is nonfluent with effortful speech and occasional paraphasic errors, but is otherwise appropriate. Cognition and memory conversationally intact. She has visual neglect to double simultaneous stimulation on the right. CRANIAL NERVES: Visual fields full to confrontation with neglect to DSS on the right as noted above. PERRL. EOMI. Facial sensation intact. There is subtle flattening of the right nasolabial fold. Hearing intact to finger rub. Uvula midline with symmetric palatal elevation. Tongue midline without fasciculations. MOTOR: Normal bulk and tone. Strength 4+/5 in right deltoid, biceps, triceps, wrist flexors and extensors, and hand intrinsics. Subtle downward and pronator drift of the right arm. Left arm full strength throughout. Strength 5/5 in iliopsoas, glutes, hamstrings, quads, and tib ant bilaterally. REFLEXES:  2+ in biceps, triceps, patella, and achilles bilaterally. Flexor plantar  responses bilaterally. SENSORY: Intact to vibration and pinprick throughout. No extinction to double simultaneous stimulation with light touch. COORDINATION: No ataxia or dysmetria on finger-nose or heel-shin maneuvers. GAIT: Not evaluated  NIH STROKE SCALE SCORE: 4  (1 for facial weakness, 1 for right arm drift, 1 for mild aphasia, 1 for visual neglect).  Lab Results: Hepatic:  14-Dec-14 10:45   Bilirubin, Total 0.3  Alkaline Phosphatase 76 (45-117 NOTE: New Reference Range 03/02/13)  SGPT (ALT) 14  SGOT (AST) 18  Total Protein, Serum 7.7  Albumin, Serum  3.2  Routine Chem:  14-Dec-14 10:45   Glucose, Serum  100  BUN 15  Creatinine (comp) 0.84  Sodium, Serum 139  Potassium, Serum 3.7  Chloride, Serum 105  CO2, Serum 31  Calcium (Total), Serum 8.6  Osmolality (calc) 278  eGFR (African American) >60  eGFR (Non-African American) >60 (eGFR values <51m/min/1.73 m2 may be an indication of chronic kidney disease (CKD). Calculated eGFR is useful in patients with stable renal function. The eGFR calculation will not be reliable in acutely ill patients when serum creatinine is changing rapidly. It is not useful in  patients on dialysis. The eGFR calculation may not be applicable to patients at the low and high extremes of body sizes, pregnant women, and vegetarians.)  Anion Gap  3  Routine Coag:  14-Dec-14 10:45   Activated PTT (APTT) 34.8 (A HCT value >55% may artifactually increase the APTT. In one study, the increase was an average of 19%. Reference: "Effect on Routine and Special Coagulation Testing Values of Citrate Anticoagulant Adjustment in Patients with High HCT Values." American Journal of Clinical Pathology 2006;126:400-405.)  Routine Hem:  14-Dec-14 10:45   WBC (CBC) 7.3  RBC (CBC) 4.88  Hemoglobin (CBC) 12.9  Hematocrit (CBC) 39.5  Platelet Count (CBC) 254 (Result(s) reported on 25 Mar 2013 at 10:56AM.)  MCV 81  MCH 26.4  MCHC 32.6  RDW 12.8   Radiology  Results: CT:    14-Dec-14 10:36, CT Head Without Contrast  CT Head Without Contrast   REASON FOR EXAM:    expressive aphasia  COMMENTS:   May transport without cardiac monitor    PROCEDURE: CT  - CT HEAD WITHOUT CONTRAST  - Mar 25 2013 10:36AM     CLINICAL DATA:  Expressive  aphasia    EXAM:  CT HEAD WITHOUT CONTRAST    TECHNIQUE:  Contiguous axial images were obtained from the base of the skull  through the vertex without intravenous contrast.    COMPARISON:  None.  FINDINGS:  Focal area of low attenuation projects within the posterior aspect  of the left frontal lobe. There is otherwise no evidence of  intra-axial or extra-axial fluid collections no evidence of acute  hemorrhage. The ventricles and cisterns are patent. There is no  evidence of mass effect. The cerebellum pons and basal ganglia  regions are unremarkable. Verymild scattered areas of low  attenuation project within the deep white matter regions.    Areas of mucosal thickening appreciated within the ethmoid air cells  and sphenoid sinus on the left. The mastoid air cells are patent.    Evaluation of basilarartery image number 6 series 2 demonstrates  increased attenuation within the vessel. There are areas of beam  hardening within the surrounding tissues. This finding may represent  sequela of artifact though if clinically appropriate thrombus within  the basilar artery cannot be excluded. If clinically warranted  further  evaluation with MRI to include MRA is recommended.     IMPRESSION:  1. 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  2. There is no evidence of acute intracranial abnormalities.  3. Small vessel white matter ischemic changes.  4. Focal area of high attenuation within the basilar artery  described above differential considerations are artifact due to beam  hardening versus an area of thrombus if clinically appropriate. If  clinically warranted further evaluation with  MRI including MRA is  recommended.    Electronically Signed    By: Margaree Mackintosh M.D.    On: 03/25/2013 10:52         Verified By: Mikki Santee, M.D., MD   Impression/Recommendations: Recommendations:   IMPRESSION: Misty Aguirre is a 49 yo right-handed woman with an extensive family history of strokes and MI at early ages who presents with sudden onset expressive aphasia. She also complained of vertigo at the onset of symptoms. She has had intermittent fluctuations in her exam but has on average improved. Her exam now shows mild expressive aphasia as well as subtle weakness of the right face and arm. Noncontrast head CT showed no acute process (the area of hyperattenuation in the basilar is difficult to interpret in the setting of the artifact at that location).  Brain MRI to evaluate for stroke. Stat head/neck MRA to evaluate for basilar artery stenosis, given the concerning presenting symptom of vertigo. Defer heparin drip unless symptoms worsen or severe basilar stenosis is detectedExtensive family history of early stroke and MI is concerning. Recommend hypercoagulable workup for hereditary causes of thrombophilia (orders entered, including lupus anticoagulant panel, anticardiolipin Abs, beta-2 glycoprotein, antithrombin-III, prothrombin gene mutation, factor V Leiden mutation, homocysteine, MTHFR, activated protein C and STTE to evaluate for cardiac sources of thrombifasting lipid panel, HgbA1cPT/OT evalASA 25m daily you for the opportunity to participate in Misty Aguirre's care. Please page neurology consults with further questions.   Electronic Signatures: HCarmin Richmond(MD)  (Signed 14-Dec-14 13:52)  Authored: Consult, History of Present Illness, Review of Systems, PAST MEDICAL/SURGICAL HISTORY, HOME MEDICATIONS, ALLERGIES, Social/Family History, NURSING VITAL SIGNS, Physical Exam-, LAB RESULTS, RADIOLOGY RESULTS, Recommendations   Last Updated: 14-Dec-14 13:52 by HCarmin Richmond(MD)

## 2014-08-02 NOTE — Discharge Summary (Signed)
PATIENT NAME:  Misty Aguirre, Misty Aguirre MR#:  [id number] DATE OF BIRTH:  Dec 08, 1965  DATE OF ADMISSION:  03/25/2013 DATE OF DISCHARGE:  03/27/2013  DISPOSITION: [facility]  FINAL DIAGNOSES: Are the same as yesterday.   DISCHARGE MEDICATIONS: Include Tylenol 650 mg q. 4 hours p.Aguirre.n. pain or temperature, aspirin 81 mg daily, NovoLog sliding scale, lorazepam 0.5 mg q. 4 hours p.Aguirre.n. anxiety, Zofran 4 mg IV push q. 4 hours p.Aguirre.n. nausea and vomiting, Lipitor 20 mg at bedtime, Lovenox 40 mg subcutaneous daily, oxycodone 5 mg orally every 4 hours p.Aguirre.n. pain.  HOSPITAL COURSE: Overnight from December 15th to December 16th, the patient stated she had a little more difficulty with her speech and trying to say her words, weakness on the right lower extremity persisted and it was about the same, incoordination with the right fingers were about the same. The patient did have a headache. She took Tylenol, which did not help, so I did prescribe some oxycodone. the headache could happen with stroke also. 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 The patient is currently on aspirin for anticoagulation. The patient will need further evaluation by cardiothoracic surgery, neurology, and cardiology for further plan.   TIME SPENT ON PATIENT CARE TODAY: 30 minutes.  ____________________________ Herschell Dimesichard J. Renae GlossWieting, MD rjw:sb D: 03/27/2013 15:06:31 ET T: 03/27/2013 15:12:58 ET JOB#: [id number]  cc: Herschell Dimesichard J. Renae GlossWieting, MD, <Dictator> Salley ScarletICHARD J Rees Matura MD ELECTRONICALLY SIGNED 03/30/2013 17:00

## 2014-08-02 NOTE — H&P (Signed)
PATIENT NAME:  Misty Aguirre, Misty Aguirre MR#:  [id number] DATE OF BIRTH:  Oct 31, 1965  DATE OF ADMISSION:  03/25/2013  REFERRING PHYSICIAN: Dr. Carollee MassedKaminski  FAMILY PHYSICIAN:  Dr. Fulton MoleHarriett Burns  REASON FOR ADMISSION: Acute stroke syndrome.   HISTORY OF PRESENT ILLNESS: The patient is a 49 year old female with a history of thyroid nodules and recent sinusitis who presents to the Emergency Room this morning with acute changes in her neurologic exam. The patient states she was sitting in a chair and she fell out suddenly from the chair. She was unable to speak. After that, she developed an expressive aphasia which slowly improved. She was brought to the Emergency Room where she remains dysarthric, but is able to communicate. In the ER, head CT showed no acute intracranial abnormalities, although a chronic lacunar infarct was noted in the left frontal lobe. She is now admitted for further evaluation. She does have a strong family history of stroke and heart disease.   PAST MEDICAL HISTORY: 1.  Thyroid nodules.  2.  Recent sinusitis.   MEDICATIONS: None.   ALLERGIES: PENICILLIN.   SOCIAL HISTORY: The patient is married. No history of alcohol or tobacco abuse.   FAMILY HISTORY: Positive coronary artery disease and stroke.  REVIEW OF SYSTEMS:  CONSTITUTIONAL: No fever or change in weight.  EYES: No blurred or double vision. No glaucoma.  ENT: No tinnitus or hearing loss. No nasal discharge or bleeding. No difficulty swallowing.  RESPIRATORY: No cough or wheezing. Denies hemoptysis.  CARDIOVASCULAR: No chest pain or orthopnea. No palpitations or syncope.  GASTROINTESTINAL: No nausea, vomiting or diarrhea. No abdominal pain. No change in bowel habits.  GENITOURINARY: No dysuria, hematuria or incontinence.  ENDOCRINE: No polyuria or polydipsia. No heat or cold intolerance.  HEMATOLOGIC: The patient denies anemia, easy bruising or bleeding.  LYMPHATIC: No swollen glands.  MUSCULOSKELETAL: The patient denies  pain in her neck, back, shoulders, knees or hips. No gout. NEUROLOGIC:  The patient has had some left upper extremity paresthesias. No focal weakness. Denies migraines. No previous stroke or seizures.  PSYCHIATRIC: The patient denies anxiety, insomnia or depression.   PHYSICAL EXAMINATION: GENERAL: The patient is in no acute distress.  VITAL SIGNS: Currently unremarkable for blood pressure of 138/90 with a heart rate of 78, respiratory rate of 20, temperature of 98.  HEENT: Normocephalic, atraumatic. Pupils equally round and reactive to light and accommodation. Extraocular movements are intact. Sclerae are anicteric. Conjunctivae are clear. Oropharynx is clear. NECK: Supple without JVD or bruits. No adenopathy or thyromegaly  noted.  LUNGS: Clear to auscultation and percussion without wheezes, rales or rhonchi. No dullness. Respiratory effort is normal.  CARDIAC: Regular rate and rhythm. Normal S1, S2. No significant rubs, murmurs or gallops. PMI is nondisplaced. Chest wall is nontender.  ABDOMEN: Soft, nontender with normoactive bowel sounds. No organomegaly or masses were appreciated. No hernias or bruits were noted.  EXTREMITIES: Without clubbing, cyanosis or edema. Pulses were 2+ bilaterally.  SKIN: Warm and dry without rash or lesions.  NEUROLOGIC: Revealed cranial nerves II through XII grossly intact. Deep tendon reflexes were symmetric. Motor and sensory exam is nonfocal. Mild dysarthria was present intermittently.  PSYCHIATRIC: Revealed a patient who was alert and oriented to person, place and time. She was cooperative and used good judgment.   LABORATORY DATA: Chest x-ray was unremarkable. Head CT revealed an old lacunar infarct in the left frontal lobe. There was no acute intracranial abnormality noted. Small vessel matter ischemic changes were present. CBC was within normal  limits. Glucose was 100 with a BUN of 15, creatinine of 0.84, a GFR of greater than 60.   ASSESSMENT: 1.  Acute  stroke syndrome manifested by expressive aphasia and now dysarthria.  2.  Old lacunar infarct by CT.  3.  Borderline hypertension.  4.  Obesity.  5.  Thyroid nodules.  6.  Hyperglycemia.   PLAN: The patient will be admitted to telemetry with aspirin and Lovenox. We will monitor her blood pressure closely and neuro checks q. 4 hours. We will obtain a neurology consult. We will check her lipid and sugar status with an A1c. We will order carotid Doppler, echocardiogram and an MRI/MRA of the brain. Follow up routine labs in the morning. Soft diet for now. Speech therapy consult has been ordered. Further treatment and evaluation will depend upon the patient's progress.  Total time spent on this patient:  50 minutes.   ____________________________ Duane Lope Judithann Sheen, MD jds:ce D: 03/25/2013 11:41:44 ET T: 03/25/2013 13:37:15 ET JOB#: [id number]  cc: Duane Lope. Judithann Sheen, MD, <Dictator> Hyman Hopes, MD JEFFREY Rodena Medin MD ELECTRONICALLY SIGNED 03/26/2013 17:07

## 2014-08-03 NOTE — Consult Note (Signed)
Chief Complaint:  Subjective/Chief Complaint Chest pain better but still persistent. No f/c/s.   VITAL SIGNS/ANCILLARY NOTES: **Vital Signs.:   05-Feb-15 12:55  Vital Signs Type Routine  Temperature Temperature (F) 98.7  Celsius 37  Temperature Source oral  Pulse Pulse 120  Respirations Respirations 18  Systolic BP Systolic BP 903  Diastolic BP (mmHg) Diastolic BP (mmHg) 009  Mean BP 113  Pulse Ox % Pulse Ox % 93  Pulse Ox Activity Level  At rest  Oxygen Delivery Room Air/ 21 %  *Intake and Output.:   05-Feb-15 11:15  Grand Totals Intake:  240 Output:      Net:  240 24 Hr.:  -920  Oral Intake      In:  240  Percentage of Meal Eaten  100   Brief Assessment:  GEN well developed, well nourished, no acute distress   Cardiac Regular  murmur present  -- JVD  --Rub  --Gallop   Respiratory normal resp effort  clear BS   Gastrointestinal Normal   Gastrointestinal details normal Soft  Nontender  Nondistended  No masses palpable   EXTR negative cyanosis/clubbing, negative edema   Lab Results: TDMs:  02-Feb-15 08:33   Vancomycin, Trough LAB 13 (Result(s) reported on 14 May 2013 at 09:21AM.)  Cardiology:  02-Feb-15 09:26   Ventricular Rate 118  Atrial Rate 118  P-R Interval 132  QRS Duration 76  QT 324  QTc 454  P Axis 10  R Axis 4  T Axis -6  ECG interpretation Sinus tachycardia Otherwise normal ECG When compared with ECG of 12-May-2013 14:48, Vent. rate has increased BY  41 BPM Right bundle branch block is no longer Present Criteria for Septal infarct are no longer Present ----------unconfirmed---------- Confirmed by OVERREAD, NOT ([id number]), editor PEARSON, BARBARA ([id number]) on 05/15/2013 8:28:32 AM  Routine Chem:  02-Feb-15 04:56   Glucose, Serum  115  BUN 9  Creatinine (comp) 0.89  Sodium, Serum  134  Potassium, Serum 3.5  Chloride, Serum 101  CO2, Serum 30  Calcium (Total), Serum 8.7  Anion Gap  3  Osmolality (calc) 268  eGFR (African American) >60  eGFR  (Non-African American) >60 (eGFR values <29m/min/1.73 m2 may be an indication of chronic kidney disease (CKD). Calculated eGFR is useful in patients with stable renal function. The eGFR calculation will not be reliable in acutely ill patients when serum creatinine is changing rapidly. It is not useful in  patients on dialysis. The eGFR calculation may not be applicable to patients at the low and high extremes of body sizes, pregnant women, and vegetarians.)  Cardiac:  02-Feb-15 08:33   Troponin I < 0.02 (0.00-0.05 0.05 ng/mL or less: NEGATIVE  Repeat testing in 3-6 hrs  if clinically indicated. >0.05 ng/mL: POTENTIAL  MYOCARDIAL INJURY. Repeat  testing in 3-6 hrs if  clinically indicated. NOTE: An increase or decrease  of 30% or more on serial  testing suggests a  clinically important change)    13:00   Troponin I < 0.02 (0.00-0.05 0.05 ng/mL or less: NEGATIVE  Repeat testing in 3-6 hrs  if clinically indicated. >0.05 ng/mL: POTENTIAL  MYOCARDIAL INJURY. Repeat  testing in 3-6 hrs if  clinically indicated. NOTE: An increase or decrease  of 30% or more on serial  testing suggests a  clinically important change)  Routine Hem:  02-Feb-15 04:56   WBC (CBC) 10.4  RBC (CBC) 4.35  Hemoglobin (CBC)  11.8  Hematocrit (CBC) 36.1  Platelet Count (CBC) 245  MCV 83  MCH 27.2  MCHC 32.7  RDW 13.4  Neutrophil % 75.8  Lymphocyte % 15.1  Monocyte % 6.7  Eosinophil % 2.0  Basophil % 0.4  Neutrophil #  7.9  Lymphocyte # 1.6  Monocyte # 0.7  Eosinophil # 0.2  Basophil # 0.0 (Result(s) reported on 14 May 2013 at [facility].)   Radiology Results: XRay:    04-Feb-15 02:09, Chest Portable Single View  Chest Portable Single View   REASON FOR EXAM:    to verify placement of PICC line  COMMENTS:       PROCEDURE: DXR - DXR PORTABLE CHEST SINGLE VIEW  - May 16 2013  2:09AM     CLINICAL DATA:  PICC line placement    EXAM:  PORTABLE CHEST - 1 VIEW    COMPARISON:   03/25/2013    FINDINGS:  Left upper extremity PICC, tip at or just below the level of the  upper cavoatrial junction. Haziness of the lower chest consistent  with bilateral pleural effusion. No edema or pneumothorax. Right  axillary dissection.     IMPRESSION:  1. Left upper extremity PICC, tip at or just below the superior  cavoatrial junction.  2. Small bilateral pleural effusion.      Electronically Signed    By: Jorje Guild M.D.    On: 05/16/2013 02:34         Verified By: Gilford Silvius, M.D.,  Cardiology:    31-Jan-15 09:13, ED ECG  Ventricular Rate 115  Atrial Rate 115  P-R Interval 152  QRS Duration 78  QT 334  QTc 462  P Axis 47  R Axis 5  T Axis 20  ECG interpretation   Sinus tachycardia  Otherwise normal ECG  When compared with ECG of 25-Mar-2013 10:14,  No significant change was found  ----------unconfirmed----------  Confirmed by OVERREAD, NOT ([id number]), editor PEARSON, BARBARA ([id number]) on 05/14/2013 12:06:32 PM  ED ECG     31-Jan-15 15:24, Echo Doppler  Echo Doppler   REASON FOR EXAM:      COMMENTS:       PROCEDURE: [facility] - ECHO DOPPLER COMPLETE(TRANSTHOR)  - May 12 2013  3:24PM     RESULT: Echocardiogram Report    Patient Name:   Misty Aguirre Date of Exam: 05/12/2013  Medical Rec #:  [id number]        Custom1:  Date ofBirth:  07/24/1965     Height:       70.0 in  Patient Age:    49 years      Weight:       246.0 lb  Patient Gender: F             BSA:          2.28 m??    Indications: Recent myoma surgery; Chest Pain  Sonographer:    Arville Go RDCS  Referring Phys: Dustin Flock, H    Sonographer Comments: Suboptimal subcostal window.    Summary:   1. Left ventricular ejection fraction, by visual estimation, is 55 to   60%.   2. Normal global left ventricular systolic function.  2D AND M-MODE MEASUREMENTS(normal ranges within parentheses):  Left Ventricle:          Normal  IVSd (2D):      1.10 cm (0.7-1.1)  LVPWd (2D):     1.30 cm  (0.7-1.1) Aorta/LA:                  Normal  LVIDd (2D):  3.94 cm (3.4-5.7) Aortic Root (2D): 3.10 cm (2.4-3.7)  LVIDs(2D):     2.81 cm           Left Atrium (2D): 3.00 cm (1.9-4.0)  LV FS (2D):     28.7 %   (>25%)  LV EF (2D):     55.9 %   (>50%)                                    Right Ventricle:                                    RVd (2D):  SPECTRAL DOPPLER ANALYSIS (where applicable):  Aortic Valve: AoV Max Vel: 1.43 m/s AoV Peak PG: 8.2 mmHg AoV Mean PG:  LVOT Vmax: 1.02 m/s LVOT VTI:  LVOT Diameter: 1.60 cm  AoV Area, Vmax: 1.43 cm?? AoV Area, VTI:  AoV Area, Vmn:  Pulmonic Valve:  PV Max Velocity: 0.92 m/sPV Max PG: 3.4 mmHg PV Mean PG:    PHYSICIAN INTERPRETATION:  Left Ventricle: The left ventricular internal cavity size was normal. LV   septal wall thickness was normal. LV posterior wall thickness was normal.   Global LV systolic function was normal. Left ventricular ejection   fraction, by visual estimation, is 55 to 60%.  Right Ventricle: The right ventricular size is normal.  Left Atrium: The left atrium is normal in size.  Right Atrium: The right atrium is normal in size.  Mitral Valve: Themitral valve is normal in structure. No evidence of   mitral valve regurgitation is seen.  Tricuspid Valve: The tricuspid valve is not well seen. Trivial tricuspid   regurgitation is visualized.  Aortic Valve: The aortic valve is tricuspid. The aortic valve is   structurally normal, with no evidence of sclerosis or stenosis.    [place] MD  Electronically signed by [id number] Bill MD  Signature Date/Time: 05/13/2013/8:16:55 AM    *** Final ***  IMPRESSION: .        Verified By: Teodoro Spray, M.D., MD    02-Feb-15 09:26, ECG  Ventricular Rate 118  Atrial Rate 118  P-R Interval 132  QRS Duration 76  QT 324  QTc 454  P Axis 10  R Axis 4  T Axis -6  ECG interpretation   Sinus tachycardia  Otherwise normal ECG  When compared with ECG of 12-May-2013  14:48,  Vent. rate has increased BY  41 BPM  Right bundle branch block is no longer Present  Criteria for Septal infarct are no longer Present  ----------unconfirmed----------  Confirmed by OVERREAD, NOT ([id number]), editor PEARSON, BARBARA ([id number]) on 05/15/2013 8:28:32 AM  ECG    Assessment/Plan:  Invasive Device Daily Assessment of Necessity:  Does the patient currently have any of the following indwelling devices? central line catheter   Central Line continued, requirement due to   Reason to continue [employer] for intravenous therapy when no peripheral lines are available   Assessment/Plan:  Assessment IMP SOB Chest Pain Pneumonia Hx CVA Elevated Glucose Poor IV access Doubt Pericarditits .   Plan PLAN Avoid narcotics for cp Agree with IV antibx for PNA Continue pain meds for chest wall pain Consider NSAIDs I do not believe this is pericarditis Agree with PIC line PT/OT Conservative cardiac input at this point Increase activity OOB to chair/Ambulate  in halls   Electronic Signatures: Lujean Amel D (MD)  (Signed 05-Feb-15 16:48)  Authored: Chief Complaint, VITAL SIGNS/ANCILLARY NOTES, Brief Assessment, Lab Results, Radiology Results, Assessment/Plan   Last Updated: 05-Feb-15 16:48 by Yolonda Kida (MD)

## 2014-08-03 NOTE — H&P (Signed)
PATIENT NAME:  Misty Aguirre, Misty Aguirre MR#:  [id number] DATE OF BIRTH:  Dec 30, 1965  DATE OF ADMISSION:  05/12/2013  PRIMARY CARE PROVIDER: Hyman HopesHarriett P. Burns, MD  EMERGENCY DEPARTMENT REFERRING PHYSICIAN: Sheran FavaMark Aguirre. Fanny BienQuale, MD   CHIEF COMPLAINT: Chest pain.   HISTORY OF PRESENT ILLNESS: The patient is a 49 year old white female who was recently hospitalized and discharged on December 16th after she presented with complaint of difficulty with speech and right lower extremity weakness. 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 The patient reports that since being discharged from there, she has been having on-and-off chest pain, which she says is on both of her chests. She has been having to take Tylenol 3 times a day. [facility]. Had an echocardiogram there, and apparently that was okay. She also reports that her sons at home were recently sick with the flu about 2 weeks ago. However, since last Thursday, she has been having pain in both of her chests. She describes it as a sharp type of pain coming and going, and also has been having nasal congestion, but no significant cough. Due to these symptoms, the patient came to the ED, and she underwent a CT scan of the chest which shows no evidence of thoracic or upper abdominal aortic aneurysm, no evidence of pulmonary embolism, but pneumonia involving the posterior right middle lobe and the deep lower lobes bilaterally, right greater than left. Therefore, we are asked to admit the patient. She denies any fevers. She otherwise denies any abdominal pain. Did have some nausea and some emesis, but has not had any diarrhea. Denies any urinary symptoms.   PAST MEDICAL HISTORY:  1. History of gestational diabetes.  2. 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  3. History of ectopic pregnancy.  4. History of  hypothyroidism.   PAST SURGICAL HISTORY:  1. Status post left-sided oophorectomy for ruptured cyst. 2. Also a recent cardiac myxoma resection.   ALLERGIES: AUGMENTIN, PENICILLIN.   CURRENT MEDICATIONS AT HOME: She is on:  1. Aspirin 81 mg 1 tab p.o. daily. 2. Levothyroxine 88 mcg daily. 3. Lipitor 10 at bedtime. 4. Tylenol 650 two tabs q.8 p.Aguirre.n.   SOCIAL HISTORY: Does not smoke. Does not drink. No drugs.   FAMILY HISTORY: Diabetes in the mother, also CVA in the mother, grandmother with CVA. Older brother with coronary artery disease.   REVIEW OF SYSTEMS:  CONSTITUTIONAL: Denies any fevers. Complains of fatigue, weakness, pain. No weight loss. No weight gain.  EYES: No blurred or double vision. No pain. No redness. No inflammation. No glaucoma. No cataracts.  ENT: No tinnitus. No ear pain. No hearing losses. Complains of nasal congestion. No sinus pain.  RESPIRATORY: Denies any cough, wheezing, hemoptysis. Complains of some dyspnea. No painful respiration. No COPD.  CARDIOVASCULAR: Complains of chest pain as above. No orthopnea. No edema. No arrhythmia. No palpitations. No syncope.  GASTROINTESTINAL: Complains of some nausea and vomiting. No diarrhea. No abdominal pain. No hematemesis. No melena. No ulcer. No GERD. No IBS. No jaundice.  GENITOURINARY: Denies any dysuria, hematuria, renal calculus or frequency.  ENDOCRINE: Denies any polyuria, nocturia. Does have hypothyroidism.  HEMATOLOGIC AND LYMPHATIC: Denies any major bruisability or bleeding.  SKIN: No acne. No rash. No changes in mole, hair or skin.  MUSCULOSKELETAL: Denies any pain in the neck, back  or shoulder.  NEUROLOGIC: Had a recent CVA. Still has some right-sided weakness. No seizures.  PSYCHIATRIC: No anxiety. No insomnia. No ADD.   PHYSICAL EXAMINATION:  VITAL SIGNS: Temperature 97, pulse 115, respirations 18, blood pressure 135/87, O2 99%.  GENERAL: The patient is a well-developed, well-nourished female in no acute  distress.  HEENT: Pupils equal, round and reactive to light and accommodation. There is no conjunctival pallor. No scleral icterus. Nasal exam shows no drainage or ulceration. Oropharynx is clear without any exudate.  NECK: Supple, without any JVD.  CARDIOVASCULAR: Regular rate and rhythm. No murmurs, rubs, clicks or gallops. PMI is not displaced.  LUNGS: She has some occasional rhonchi, but there are no rales, crackles or wheezing.  ABDOMEN: Soft, nontender, nondistended. Positive bowel sounds x4.  EXTREMITIES: No clubbing, cyanosis or edema.  SKIN: No rash.  LYMPHATICS: No lymph nodes palpable.  VASCULAR: Good DP, PT pulses.  PSYCHIATRIC: Not anxious or depressed.  NEUROLOGICAL: Awake, alert and oriented x3. No focal deficits.   EVALUATION: Glucose is 160, BUN 17, creatinine 0.84, sodium 131, potassium 3.9, chloride 100, CO2 is 26, calcium 9.5. The beta-hCG less than 1. LFTs: Total protein 8.3, albumin 3.4, bilirubin total 0.5. Troponin less than 0.02. WBC 14.3, hemoglobin 12.4, platelet count 244. CT scan of the chest shows no evidence of thoracic or upper abdominal aortic aneurysm or dissection. No evidence of central pulmonary embolism. Pneumonia along the posterior right middle lobe and the deep lower lobes bilaterally, right greater than left.   ASSESSMENT AND PLAN: The patient is a 49 year old white female who was admitted with cerebrovascular accident  in December here, transferred to [facility] for resection of cardiac myxoma, status post resection, who has had chest pain since, now presenting with worsening chest pain, noted to have bilateral pneumonia.   1. Likely healthcare-associated pneumonia. At this time, DUE TO PENICILLIN ALLERGY, we will treat her with IV levofloxacin, IV meropenem and vancomycin. I am going to place her on some Mucinex, incentive spirometry as well and oxygen as needed.  2. Chest pain, likely related to her surgery, but also could be related to pneumonia. Will rule  out any pain related to her surgery with echocardiogram of the heart. If symptoms persist, we may consider cardiology consult.  3. Hyperglycemia noted on BMP. Will check a hemoglobin A1c.  4. Hypothyroidism. Continue Synthroid as taking at home. Check a TSH.  5. Miscellaneous. I will place her on Lovenox for deep vein thrombosis prophylaxis.  TIME SPENT: 50 minutes spent on this H and P.    ____________________________ Lacie Scotts. Allena Katz, MD shp:lb D: 05/12/2013 13:13:03 ET T: 05/12/2013 13:33:51 ET JOB#: [id number]  cc: Jovoni Borkenhagen H. Allena Katz, MD, <Dictator> Charise Carwin MD ELECTRONICALLY SIGNED 05/27/2013 13:09

## 2014-08-03 NOTE — Consult Note (Signed)
PATIENT NAME:  Misty Aguirre, Misty Aguirre MR#:  [id number] DATE OF BIRTH:  1966/02/03  DATE OF CONSULTATION:  05/14/2013  REFERRING PHYSICIAN: Dr. Allena Katz.  CONSULTING PHYSICIAN:  Dwayne D. Juliann Pares, MD CARDIOLOGIST: Dr. Lady Gary.   INDICATION: Chest pain, history of myxoma, and pneumonia.   HISTORY OF PRESENT ILLNESS: Ms. Delorey is a 49 year old white female recently hospitalized and discharged back in December who had presentation of CVA, found to have a myxoma and transferred to [facility] and had cardiac cath and subsequent excision of the myxoma. She went home late in December from [place] and was doing reasonably well up until about 3 or 4 days ago when she started getting short of breath, weak, tired, fatigued, with no energy and started having the chest pain.   She initially called Dr. Lady Gary to be seen but said that the pain was better so she did not come in, but by the next day, the pain was worse, so she came to the Emergency Room.   Work-up in the ER including CT to rule out PT and DVT were done but x-ray suggested pneumonia. She was subsequently admitted for further evaluation and care and pain control.   Denies much in the way of fever. No new focal neurologic findings.   FAMILY HISTORY: Diabetes, CVA, coronary artery disease.   SOCIAL HISTORY: No smoking. No drinking or drinking. Married with children.   PAST MEDICAL HISTORY: Gestational diabetes, CVA likely related to myxoma, ectopic pregnancy, hypothyroidism.   PAST SURGICAL HISTORY: Oophorectomy because of a ruptured cysts, recent myxoma excision at [facility].   MEDICATIONS: Aspirin 81 mg a day, levothyroxine 88 mcg a day, Lipitor 10 mg at bedtime, Tylenol p.r.n.   ALLERGIES: AUGMENTIN, PENICILLIN.   REVIEW OF SYSTEMS: Chest pain, weakness, fatigue, persistent right-sided hemiparalysis. Otherwise negative.   PHYSICAL EXAMINATION:  VITAL SIGNS: Blood pressure 135/80, pulse 100, respiratory rate of 14.  HEENT: Normocephalic, atraumatic. Pupils equal  and reactive to light.  NECK: Supple. No significant JVD, bruits or adenopathy.  LUNGS: Clear to auscultation and percussion. No significant wheezing, rhonchi, or rales. Had crackles in the bases bilaterally.  HEART: Regular rate and rhythm. Positive bowel sounds. No rebound, guarding or tenderness.  EXTREMITIES: Within normal limits.  HEART: Regular rate and rhythm. No significant murmur, gallops, or rubs.  ABDOMEN: Benign.  EXTREMITIES: Within normal limits.  NEUROLOGIC: Intact.  SKIN: Normal.   LABORATORIES: Glucose 160, BUN 17, creatinine 0.84, sodium 131, potassium 3.9, chloride 100, CO2 26, calcium 9.5. LFTs normal. White count 14, hemoglobin 12. 4, platelet count 244.   Chest CT essentially unremarkable except for possible pneumonia in right middle and bilateral lower lobes.   EKG: Normal sinus rhythm. Nonspecific ST-T-wave changes.   IMPRESSION:  1.  Multilobar pneumonia and chest pain, possibly related to pneumonia, possible pericarditis, but less likely.  2.  Hyperglycemia.  3.  Hypothyroidism.  4.  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   PLAN: Agree with admit. Place on broad spectrum antibiotics for pneumonia. Consider pulmonary consult. Consider aggressive care since this may be a healthcare-related pneumonia. I do not believe this is pericarditis. We will continue NSAIDs anyway, either ibuprofen or Indocin. If this does not improve her pain, then would consider colchicine. Do not recommend steroids at this point. Continue with low doses of narcotics for acute pain. Consider echocardiogram. Do not recommend cardiac catheterization. I do not necessarily recommend anticoagulation. Will treat the patient medically for now.   ____________________________ Bobbie Stack. Callwood, MD ddc:np  D: 05/14/2013 14:56:30 ET T: 05/14/2013 15:40:53 ET JOB#: [id number]  cc: Dwayne D. Juliann Paresallwood, MD, <Dictator> Alwyn PeaWAYNE D CALLWOOD MD ELECTRONICALLY SIGNED  06/25/2013 7:48

## 2014-08-03 NOTE — Consult Note (Signed)
Chief Complaint:  Subjective/Chief Complaint Pt still having cp. NMo worsening sob no fever.S/P pic line..Getting pain meds for cp.   VITAL SIGNS/ANCILLARY NOTES: **Vital Signs.:   04-Feb-15 12:10  Vital Signs Type Routine  Temperature Temperature (F) 97.6  Celsius 36.4  Temperature Source oral  Pulse Pulse 110  Respirations Respirations 18  Systolic BP Systolic BP 706  Diastolic BP (mmHg) Diastolic BP (mmHg) 83  Mean BP 99  Pulse Ox % Pulse Ox % 100  Pulse Ox Activity Level  At rest  Oxygen Delivery Room Air/ 21 %  *Intake and Output.:   Shift 04-Feb-15 15:00  Grand Totals Intake:  480 Output:  1900    Net:  -1420 24 Hr.:  -1420  Oral Intake      In:  480  Urine ml     Out:  1900  Length of Stay Totals Intake:  6207 Output:  15200    Net:  -8993   Brief Assessment:  GEN well developed, well nourished, no acute distress   Cardiac Regular  murmur present  -- JVD  --Rub  --Gallop   Respiratory normal resp effort  clear BS   Gastrointestinal Normal   Gastrointestinal details normal Soft  Nontender  Nondistended  No masses palpable   EXTR negative cyanosis/clubbing, negative edema   Lab Results: TDMs:  02-Feb-15 08:33   Vancomycin, Trough LAB 13 (Result(s) reported on 14 May 2013 at 09:21AM.)  Cardiology:  02-Feb-15 09:26   Ventricular Rate 118  Atrial Rate 118  P-R Interval 132  QRS Duration 76  QT 324  QTc 454  P Axis 10  R Axis 4  T Axis -6  ECG interpretation Sinus tachycardia Otherwise normal ECG When compared with ECG of 12-May-2013 14:48, Vent. rate has increased BY  41 BPM Right bundle branch block is no longer Present Criteria for Septal infarct are no longer Present ----------unconfirmed---------- Confirmed by OVERREAD, NOT ([id number]), editor PEARSON, BARBARA ([id number]) on 05/15/2013 8:28:32 AM  Routine Chem:  02-Feb-15 04:56   Glucose, Serum  115  BUN 9  Creatinine (comp) 0.89  Potassium, Serum 3.5  Chloride, Serum 101  CO2, Serum 30  Calcium  (Total), Serum 8.7  Anion Gap  3  Osmolality (calc) 268  eGFR (African American) >60  eGFR (Non-African American) >60 (eGFR values <28mL/min/1.73 m2 may be an indication of chronic kidney disease (CKD). Calculated eGFR is useful in patients with stable renal function. The eGFR calculation will not be reliable in acutely ill patients when serum creatinine is changing rapidly. It is not useful in  patients on dialysis. The eGFR calculation may not be applicable to patients at the low and high extremes of body sizes, pregnant women, and vegetarians.)  Cardiac:  02-Feb-15 08:33   Troponin I < 0.02 (0.00-0.05 0.05 ng/mL or less: NEGATIVE  Repeat testing in 3-6 hrs  if clinically indicated. >0.05 ng/mL: POTENTIAL  MYOCARDIAL INJURY. Repeat  testing in 3-6 hrs if  clinically indicated. NOTE: An increase or decrease  of 30% or more on serial  testing suggests a  clinically important change)    13:00   Troponin I < 0.02 (0.00-0.05 0.05 ng/mL or less: NEGATIVE  Repeat testing in 3-6 hrs  if clinically indicated. >0.05 ng/mL: POTENTIAL  MYOCARDIAL INJURY. Repeat  testing in 3-6 hrs if  clinically indicated. NOTE: An increase or decrease  of 30% or more on serial  testing suggests a  clinically important change)  Routine Hem:  02-Feb-15 04:56   WBC (  CBC) 10.4  RBC (CBC) 4.35  Hemoglobin (CBC)  11.8  Hematocrit (CBC) 36.1  Platelet Count (CBC) 245  MCV 83  MCH 27.2  MCHC 32.7  RDW 13.4  Neutrophil % 75.8  Lymphocyte % 15.1  Monocyte % 6.7  Eosinophil % 2.0  Basophil % 0.4  Neutrophil #  7.9  Lymphocyte # 1.6  Monocyte # 0.7  Eosinophil # 0.2  Basophil # 0.0 (Result(s) reported on 14 May 2013 at [facility].)   Radiology Results: XRay:    04-Feb-15 02:09, Chest Portable Single View  Chest Portable Single View   REASON FOR EXAM:    to verify placement of PICC line  COMMENTS:       PROCEDURE: DXR - DXR PORTABLE CHEST SINGLE VIEW  - May 16 2013  2:09AM     CLINICAL  DATA:  PICC line placement    EXAM:  PORTABLE CHEST - 1 VIEW    COMPARISON:  03/25/2013    FINDINGS:  Left upper extremity PICC, tip at or just below the level of the  upper cavoatrial junction. Haziness of the lower chest consistent  with bilateral pleural effusion. No edema or pneumothorax. Right  axillary dissection.     IMPRESSION:  1. Left upper extremity PICC, tip at or just below the superior  cavoatrial junction.  2. Small bilateral pleural effusion.      Electronically Signed    By: Jorje Guild M.D.    On: 05/16/2013 02:34         Verified By: Gilford Silvius, M.D.,  Cardiology:    31-Jan-15 09:13, ED ECG  Ventricular Rate 115  Atrial Rate 115  P-R Interval 152  QRS Duration 78  QT 334  QTc 462  P Axis 47  R Axis 5  T Axis 20  ECG interpretation   Sinus tachycardia  Otherwise normal ECG  When compared with ECG of 25-Mar-2013 10:14,  No significant change was found  ----------unconfirmed----------  Confirmed by OVERREAD, NOT ([id number]), editor PEARSON, BARBARA ([id number]) on 05/14/2013 12:06:32 PM  ED ECG     31-Jan-15 15:24, Echo Doppler  Echo Doppler   REASON FOR EXAM:      COMMENTS:       PROCEDURE: [facility] - ECHO DOPPLER COMPLETE(TRANSTHOR)  - May 12 2013  3:24PM     RESULT: Echocardiogram Report    Patient Name:   REHMAT MURTAGH Date of Exam: 05/12/2013  Medical Rec #:  [id number]        Custom1:  Date ofBirth:  28-Jun-1965     Height:       70.0 in  Patient Age:    49 years      Weight:       246.0 lb  Patient Gender: F             BSA:          2.28 m??    Indications: Recent myoma surgery; Chest Pain  Sonographer:    Arville Go RDCS  Referring Phys: Dustin Flock, H    Sonographer Comments: Suboptimal subcostal window.    Summary:   1. Left ventricular ejection fraction, by visual estimation, is 55 to   60%.   2. Normal global left ventricular systolic function.  2D AND M-MODE MEASUREMENTS(normal ranges within parentheses):  Left Ventricle:           Normal  IVSd (2D):      1.10 cm (0.7-1.1)  LVPWd (2D):     1.30  cm (0.7-1.1) Aorta/LA:                  Normal  LVIDd (2D):     3.94 cm (3.4-5.7) Aortic Root (2D): 3.10 cm (2.4-3.7)  LVIDs(2D):     2.81 cm           Left Atrium (2D): 3.00 cm (1.9-4.0)  LV FS (2D):     28.7 %   (>25%)  LV EF (2D):     55.9 %   (>50%)                                    Right Ventricle:                                    RVd (2D):  SPECTRAL DOPPLER ANALYSIS (where applicable):  Aortic Valve: AoV Max Vel: 1.43 m/s AoV Peak PG: 8.2 mmHg AoV Mean PG:  LVOT Vmax: 1.02 m/s LVOT VTI:  LVOT Diameter: 1.60 cm  AoV Area, Vmax: 1.43 cm?? AoV Area, VTI:  AoV Area, Vmn:  Pulmonic Valve:  PV Max Velocity: 0.92 m/sPV Max PG: 3.4 mmHg PV Mean PG:    PHYSICIAN INTERPRETATION:  Left Ventricle: The left ventricular internal cavity size was normal. LV   septal wall thickness was normal. LV posterior wall thickness was normal.   Global LV systolic function was normal. Left ventricular ejection   fraction, by visual estimation, is 55 to 60%.  Right Ventricle: The right ventricular size is normal.  Left Atrium: The left atrium is normal in size.  Right Atrium: The right atrium is normal in size.  Mitral Valve: Themitral valve is normal in structure. No evidence of   mitral valve regurgitation is seen.  Tricuspid Valve: The tricuspid valve is not well seen. Trivial tricuspid   regurgitation is visualized.  Aortic Valve: The aortic valve is tricuspid. The aortic valve is   structurally normal, with no evidence of sclerosis or stenosis.    [place] MD  Electronically signed by [id number] Bill MD  Signature Date/Time: 05/13/2013/8:16:55 AM    *** Final ***  IMPRESSION: .        Verified By: Teodoro Spray, M.D., MD    02-Feb-15 09:26, ECG  Ventricular Rate 118  Atrial Rate 118  P-R Interval 132  QRS Duration 76  QT 324  QTc 454  P Axis 10  R Axis 4  T Axis -6  ECG interpretation   Sinus  tachycardia  Otherwise normal ECG  When compared with ECG of 12-May-2013 14:48,  Vent. rate has increased BY  41 BPM  Right bundle branch block is no longer Present  Criteria for Septal infarct are no longer Present  ----------unconfirmed----------  Confirmed by OVERREAD, NOT ([id number]), editor PEARSON, BARBARA ([id number]) on 05/15/2013 8:28:32 AM  ECG    Assessment/Plan:  Invasive Device Daily Assessment of Necessity:  Does the patient currently have any of the following indwelling devices? central line catheter   Central Line continued, requirement due to   Reason to continue [employer] for intravenous therapy when no peripheral lines are available   Assessment/Plan:  Assessment IMP Chest Pain Pneumonia Hx CVA Elevated Glucose Poor IV access Doubt Pericarditits .   Plan PLAN Agree with IV antibx for PNA Continue pain meds for chest wall pain Consider NSAIDs  I do not believe this is pericarditis Agree with PIC line PT/OT Conservative cardiac input at this point   Electronic Signatures: Yolonda Kida (MD)  (Signed 04-Feb-15 19:37)  Authored: Chief Complaint, VITAL SIGNS/ANCILLARY NOTES, Brief Assessment, Lab Results, Radiology Results, Assessment/Plan   Last Updated: 04-Feb-15 19:37 by Lujean Amel D (MD)

## 2014-08-03 NOTE — Discharge Summary (Signed)
PATIENT NAME:  Misty Aguirre, Misty Aguirre MR#:  [id number] DATE OF BIRTH:  [birth year]  DATE OF ADMISSION:  05/12/2013 DATE OF DISCHARGE:  05/17/2013  CONSULTANTS: Dr. Juliann Pares from cardiology   CHIEF COMPLAINT: Chest pain.   PRIMARY CARE PHYSICIAN: Dr. Lawerance Bach at [facility].  DISCHARGE DIAGNOSES: 1.  Healthcare-associated pneumonia.  2.  Chest pain, likely surgical site and pleuritic in nature, likely noncardiac.  3.  Hypothyroidism.  4.  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  5.  History of ectopic pregnancy.  6.  History of gestational diabetes.   DISCHARGE MEDICATIONS: Levothyroxine 88 mcg daily, Lipitor 10 mg daily, aspirin 81 mg daily, Tylenol 650 mg every 8 hours as needed for pain, levofloxacin 750 mg 1 tab daily for 2 days, doxycycline 100 mg 1 tab 2 times a day for 2 days.  She will be getting discharged with home health, PT.   DIET: Low sodium, low fat, low cholesterol.   ACTIVITY: As tolerated.   Please follow with PCP within 1 to 2 weeks. Repeat x-ray of the chest in 4 to 6 weeks for evaluation of her resolution of the pneumonia. The patient is full code.  SIGNIFICANT LABORATORIES AND IMAGING:  Initial BUN 17, creatinine 0.84. Sodium 131, potassium 3.9. Troponin initially negative. TSH was 0.56. Initial white count of 14.3, hemoglobin 12.4, platelets of 244. Last white count of 10.4. The patient had 2 more troponins which were negative on 02/02. Blood cultures on arrival, no growth to date. Rapid influenza was negative. Repeat echocardiogram had shown EF of 55% to 60% with right ventricular size being normal, left atrium normal, right atrium normal sizes with mitral valve being normal size.  CT angio of chest done for dissection had showed no evidence of thoracic or upper abdominal aortic aneurysm or dissection. No visible atherosclerosis. No evidence of central pulmonary embolism. Pneumonia involving the posterior right middle lobe and deep lower lobes  bilaterally right greater than left, with small right pleural effusion. No left pleural effusion.  HISTORY OF PRESENT ILLNESS AND HOSPITAL COURSE:  For full details of H and P, please see the dictation on 01/31 by Dr. Allena Katz, but briefly this is a 49 year old female who was recently hospitalized and discharged on 12/16 and discharged to [facility]. Initially she had come in for difficulty with speech and right lower extremity weakness. 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 She came in after having pain in both sides of her chest since Thursday and the patient did have a sick contact with the flu. She had a CT chest done which showed the pneumonia and therefore was admitted to the hospitalist service for healthcare-associated pneumonia. She had blood cultures sent and rapid flu was done, which was negative. She was started on meropenem, levofloxacin and vancomycin due PENICILLIN ALLERGY. She did not have significant cough nor was she hypoxic. She was maintained on dose of IV antibiotics for multiple days and slowly weaned off the meropenem, and the Levaquin was transitioned into tablets. The patient all 6 days of antibiotics here, and she will be discharged with additional 2 days of Levaquin and doxycycline for broad coverage. She is not hypoxic, nor is she febrile.  In regards to her chest pain, they are more at surgical site and pleuritic in nature. Cardiology was consulted, who does not believe that this is pericarditis and recommended treatment for the pneumonia. We repeated the echocardiogram here which  showed normal ejection fraction and no significant valvular abnormalities. We have checked her TSH and it is acceptable range. In regards to her recent CVA, which was likely embolic in nature due to  myxoma, she has had surgery for that and she can [facility]. She was maintained on her aspirin and statin. She was seen by physical  therapy and the recommendation was home services with PT and that has been arranged for her.  TOTAL TIME SPENT ON THIS DISCHARGE:  35 minutes.   ____________________________ Misty EatonShayiq Lillieann Pavlich, MD sa:ce D: 05/17/2013 17:51:08 ET T: 05/17/2013 18:51:35 ET JOB#: [id number]  cc: Misty EatonShayiq Zadiel Leyh, MD, <Dictator> Hyman HopesHarriett P. Burns, MD   Misty EatonSHAYIQ Egypt Marchiano MD ELECTRONICALLY SIGNED 05/26/2013 11:00

## 2014-08-03 NOTE — Consult Note (Signed)
Chief Complaint:  Subjective/Chief Complaint Pt with HA but recurrent cheest pain. Responding to pain meds   VITAL SIGNS/ANCILLARY NOTES: **Vital Signs.:   03-Feb-15 13:35  Vital Signs Type Routine  Temperature Temperature (F) 99  Celsius 37.2  Temperature Source oral  Pulse Pulse 117  Respirations Respirations 18  Systolic BP Systolic BP 782  Diastolic BP (mmHg) Diastolic BP (mmHg) 83  Mean BP 94  Pulse Ox % Pulse Ox % 90  Pulse Ox Activity Level  At rest  Oxygen Delivery Room Air/ 21 %  *Intake and Output.:   03-Feb-15 12:04  Grand Totals Intake:   Output:  500    Net:  -29 24 Hr.:  -420  Urine ml     Out:  500  Urinary Method  Up to BR   Brief Assessment:  GEN well developed, well nourished, no acute distress   Cardiac Regular  no murmur  -- JVD  --Gallop   Respiratory normal resp effort  clear BS   Gastrointestinal Normal   Gastrointestinal details normal Soft  Nontender  Nondistended  Bowel sounds normal   EXTR negative cyanosis/clubbing, negative edema   Lab Results: TDMs:  02-Feb-15 08:33   Vancomycin, Trough LAB 13 (Result(s) reported on 14 May 2013 at 09:21AM.)  Cardiology:  02-Feb-15 09:26   Ventricular Rate 118  Atrial Rate 118  P-R Interval 132  QRS Duration 76  QT 324  QTc 454  P Axis 10  R Axis 4  T Axis -6  ECG interpretation Sinus tachycardia Otherwise normal ECG When compared with ECG of 12-May-2013 14:48, Vent. rate has increased BY  41 BPM Right bundle branch block is no longer Present Criteria for Septal infarct are no longer Present ----------unconfirmed---------- Confirmed by OVERREAD, NOT ([id number]), editor PEARSON, BARBARA ([id number]) on 05/15/2013 8:28:32 AM  Routine Chem:  02-Feb-15 04:56   Glucose, Serum  115  BUN 9  Creatinine (comp) 0.89  Sodium, Serum  134  Potassium, Serum 3.5  Chloride, Serum 101  CO2, Serum 30  Calcium (Total), Serum 8.7  Anion Gap  3  Osmolality (calc) 268  eGFR (African American) >60  eGFR  (Non-African American) >60 (eGFR values <18mL/min/1.73 m2 may be an indication of chronic kidney disease (CKD). Calculated eGFR is useful in patients with stable renal function. The eGFR calculation will not be reliable in acutely ill patients when serum creatinine is changing rapidly. It is not useful in  patients on dialysis. The eGFR calculation may not be applicable to patients at the low and high extremes of body sizes, pregnant women, and vegetarians.)  Cardiac:  02-Feb-15 08:33   Troponin I < 0.02 (0.00-0.05 0.05 ng/mL or less: NEGATIVE  Repeat testing in 3-6 hrs  if clinically indicated. >0.05 ng/mL: POTENTIAL  MYOCARDIAL INJURY. Repeat  testing in 3-6 hrs if  clinically indicated. NOTE: An increase or decrease  of 30% or more on serial  testing suggests a  clinically important change)    13:00   Troponin I < 0.02 (0.00-0.05 0.05 ng/mL or less: NEGATIVE  Repeat testing in 3-6 hrs  if clinically indicated. >0.05 ng/mL: POTENTIAL  MYOCARDIAL INJURY. Repeat  testing in 3-6 hrs if  clinically indicated. NOTE: An increase or decrease  of 30% or more on serial  testing suggests a  clinically important change)  Routine Hem:  02-Feb-15 04:56   WBC (CBC) 10.4  RBC (CBC) 4.35  Hemoglobin (CBC)  11.8  Hematocrit (CBC) 36.1  Platelet Count (CBC) 245  MCV 83  MCH 27.2  MCHC 32.7  RDW 13.4  Neutrophil % 75.8  Lymphocyte % 15.1  Monocyte % 6.7  Eosinophil % 2.0  Basophil % 0.4  Neutrophil #  7.9  Lymphocyte # 1.6  Monocyte # 0.7  Eosinophil # 0.2  Basophil # 0.0 (Result(s) reported on 14 May 2013 at [facility].)   Radiology Results: XRay:    04-Feb-15 02:09, Chest Portable Single View  Chest Portable Single View   REASON FOR EXAM:    to verify placement of PICC line  COMMENTS:       PROCEDURE: DXR - DXR PORTABLE CHEST SINGLE VIEW  - May 16 2013  2:09AM     CLINICAL DATA:  PICC line placement    EXAM:  PORTABLE CHEST - 1 VIEW    COMPARISON:   03/25/2013    FINDINGS:  Left upper extremity PICC, tip at or just below the level of the  upper cavoatrial junction. Haziness of the lower chest consistent  with bilateral pleural effusion. No edema or pneumothorax. Right  axillary dissection.     IMPRESSION:  1. Left upper extremity PICC, tip at or just below the superior  cavoatrial junction.  2. Small bilateral pleural effusion.      Electronically Signed    By: Jorje Guild M.D.    On: 05/16/2013 02:34         Verified By: Gilford Silvius, M.D.,  Cardiology:    31-Jan-15 09:13, ED ECG  Ventricular Rate 115  Atrial Rate 115  P-R Interval 152  QRS Duration 78  QT 334  QTc 462  P Axis 47  R Axis 5  T Axis 20  ECG interpretation   Sinus tachycardia  Otherwise normal ECG  When compared with ECG of 25-Mar-2013 10:14,  No significant change was found  ----------unconfirmed----------  Confirmed by OVERREAD, NOT ([id number]), editor PEARSON, BARBARA ([id number]) on 05/14/2013 12:06:32 PM  ED ECG     31-Jan-15 15:24, Echo Doppler  Echo Doppler   REASON FOR EXAM:      COMMENTS:       PROCEDURE: [facility] - ECHO DOPPLER COMPLETE(TRANSTHOR)  - May 12 2013  3:24PM     RESULT: Echocardiogram Report    Patient Name:   Misty Aguirre Date of Exam: 05/12/2013  Medical Rec #:  [id number]        Custom1:  Date ofBirth:  [birth year]     Height:       70.0 in  Patient Age:    49 years      Weight:       246.0 lb  Patient Gender: F             BSA:          2.28 m??    Indications: Recent myoma surgery; Chest Pain  Sonographer:    Arville Go RDCS  Referring Phys: Dustin Flock, H    Sonographer Comments: Suboptimal subcostal window.    Summary:   1. Left ventricular ejection fraction, by visual estimation, is 55 to   60%.   2. Normal global left ventricular systolic function.  2D AND M-MODE MEASUREMENTS(normal ranges within parentheses):  Left Ventricle:          Normal  IVSd (2D):      1.10 cm (0.7-1.1)  LVPWd (2D):     1.30 cm  (0.7-1.1) Aorta/LA:                  Normal  LVIDd (2D):  3.94 cm (3.4-5.7) Aortic Root (2D): 3.10 cm (2.4-3.7)  LVIDs(2D):     2.81 cm           Left Atrium (2D): 3.00 cm (1.9-4.0)  LV FS (2D):     28.7 %   (>25%)  LV EF (2D):     55.9 %   (>50%)                                    Right Ventricle:                                    RVd (2D):  SPECTRAL DOPPLER ANALYSIS (where applicable):  Aortic Valve: AoV Max Vel: 1.43 m/s AoV Peak PG: 8.2 mmHg AoV Mean PG:  LVOT Vmax: 1.02 m/s LVOT VTI:  LVOT Diameter: 1.60 cm  AoV Area, Vmax: 1.43 cm?? AoV Area, VTI:  AoV Area, Vmn:  Pulmonic Valve:  PV Max Velocity: 0.92 m/sPV Max PG: 3.4 mmHg PV Mean PG:    PHYSICIAN INTERPRETATION:  Left Ventricle: The left ventricular internal cavity size was normal. LV   septal wall thickness was normal. LV posterior wall thickness was normal.   Global LV systolic function was normal. Left ventricular ejection   fraction, by visual estimation, is 55 to 60%.  Right Ventricle: The right ventricular size is normal.  Left Atrium: The left atrium is normal in size.  Right Atrium: The right atrium is normal in size.  Mitral Valve: Themitral valve is normal in structure. No evidence of   mitral valve regurgitation is seen.  Tricuspid Valve: The tricuspid valve is not well seen. Trivial tricuspid   regurgitation is visualized.  Aortic Valve: The aortic valve is tricuspid. The aortic valve is   structurally normal, with no evidence of sclerosis or stenosis.    [place] MD  Electronically signed by [id number] Bill MD  Signature Date/Time: 05/13/2013/8:16:55 AM    *** Final ***  IMPRESSION: .        Verified By: Teodoro Spray, M.D., MD    02-Feb-15 09:26, ECG  Ventricular Rate 118  Atrial Rate 118  P-R Interval 132  QRS Duration 76  QT 324  QTc 454  P Axis 10  R Axis 4  T Axis -6  ECG interpretation   Sinus tachycardia  Otherwise normal ECG  When compared with ECG of 12-May-2013  14:48,  Vent. rate has increased BY  41 BPM  Right bundle branch block is no longer Present  Criteria for Septal infarct are no longer Present  ----------unconfirmed----------  Confirmed by OVERREAD, NOT ([id number]), editor PEARSON, BARBARA ([id number]) on 05/15/2013 8:28:32 AM  ECG    Assessment/Plan:  Invasive Device Daily Assessment of Necessity:  Does the patient currently have any of the following indwelling devices? none   Assessment/Plan:  Assessment IMP Pneumonia Atypical chest pain Anxiety Mild obesity Thyroid disease Hx CVA S/P Myxoma rescection Elevated Blood sugar   Plan PLAN Pain meds for CP Consider NSAIDs Agree with IV antibx Rec PT/OT Continue thyroid meds Continue benzo Agree with ECHO Do not rec cath DVT prophylaxis   Electronic Signatures: Lujean Amel D (MD)  (Signed 04-Feb-15 19:47)  Authored: Chief Complaint, VITAL SIGNS/ANCILLARY NOTES, Brief Assessment, Lab Results, Radiology Results, Assessment/Plan   Last Updated: 04-Feb-15 19:47 by Lujean Amel D (MD)

## 2015-08-25 ENCOUNTER — Ambulatory Visit: Payer: Self-pay | Attending: Oncology

## 2015-08-25 ENCOUNTER — Ambulatory Visit
Admission: RE | Admit: 2015-08-25 | Discharge: 2015-08-25 | Disposition: A | Discharge status: Home / Self Care | Payer: Self-pay | Source: Ambulatory Visit | Attending: Oncology | Admitting: Oncology

## 2015-08-25 VITALS — BP 121/88 | HR 72 | Temp 96.7°F | Resp 20 | Ht 68.5 in | Wt 267.9 lb

## 2015-08-25 DIAGNOSIS — Z0000 Encounter for general adult medical examination without abnormal findings: Secondary | ICD-10-CM

## 2015-08-25 NOTE — Progress Notes (Signed)
Letter mailed from [facility] to notify of normal mammogram results.  Patient to return in one year for annual screening.  Copy to [facility]. 

## 2015-08-25 NOTE — Progress Notes (Signed)
Subjective:     Patient ID: Misty Aguirre, female   DOB: June 22, 1965, 50 y.o.   MRN: [id number]  HPI   Review of Systems     Objective:   Physical Exam  Pulmonary/Chest: Right breast exhibits no inverted nipple, no mass, no nipple discharge, no skin change and no tenderness. Left breast exhibits no inverted nipple, no mass, no nipple discharge, no skin change and no tenderness. Breasts are symmetrical.    Cardiac surgery scars right chest       Assessment:     50 year old patient presents for BCCCP clinic visitPatient screened, and meets BCCCP eligibility.  Patient does not have insurance, Medicare or Medicaid.  Handout given on Affordable Care Act.  Instructed patient on breast self-exam using teach back method.  CBE unremarkable.   Patient reports she has had a stroke effecting her right side, and cardiac issues since last visit.  [facility] .    Plan:     Sent for bilateral screening mammogram.

## 2017-09-26 ENCOUNTER — Ambulatory Visit: Payer: Self-pay | Attending: Oncology

## 2017-09-26 ENCOUNTER — Encounter (INDEPENDENT_AMBULATORY_CARE_PROVIDER_SITE_OTHER): Payer: Self-pay

## 2017-09-26 ENCOUNTER — Ambulatory Visit
Admission: RE | Admit: 2017-09-26 | Discharge: 2017-09-26 | Disposition: A | Discharge status: Home / Self Care | Payer: Self-pay | Source: Ambulatory Visit | Attending: Oncology | Admitting: Oncology

## 2017-09-26 ENCOUNTER — Other Ambulatory Visit: Payer: Self-pay

## 2017-09-26 VITALS — BP 112/78 | HR 78 | Temp 97.6°F | Ht 69.0 in | Wt 255.0 lb

## 2017-09-26 DIAGNOSIS — Z0000 Encounter for general adult medical examination without abnormal findings: Secondary | ICD-10-CM

## 2017-09-26 NOTE — Progress Notes (Signed)
  Subjective:     Patient ID: Misty Aguirre, female   DOB: [birth year], 52 y.o.   MRN: [id number]  HPI   Review of Systems     Objective:   Physical Exam  Pulmonary/Chest: Right breast exhibits no inverted nipple, no mass, no nipple discharge, no skin change and no tenderness. Left breast exhibits no inverted nipple, no mass, no nipple discharge, no skin change and no tenderness. Breasts are symmetrical.  Scar right breast from cardiac surgery         Assessment:     52 [facility]  Patient screened, and meets [facility] eligibility.  Patient does not have insurance, Medicare or Medicaid.  Handout given on Affordable Care Act.  Instructed patient on breast self awareness using teach back method.  Clinical breast exam unremarkable.  No mass or lump.  Patient had a stroke in 2014 resulting in right-sided weakness.  [facility]    Plan:     Sent for bilateral screening mammogram.

## 2017-09-27 NOTE — Progress Notes (Signed)
Letter mailed from [facility] to notify of normal mammogram results.  Patient to return in one year for annual screening.  Copy to [facility]. 

## 2018-10-15 IMAGING — MG MM DIGITAL SCREENING BILAT W/ TOMO W/ CAD
6 of 10 series · 6 of 30 positions shown · non-contrast
Comparison: Previous exam(s).

CLINICAL DATA: Screening.

EXAM:
DIGITAL SCREENING BILATERAL MAMMOGRAM WITH TOMO AND CAD

[L MLO synth-2D]
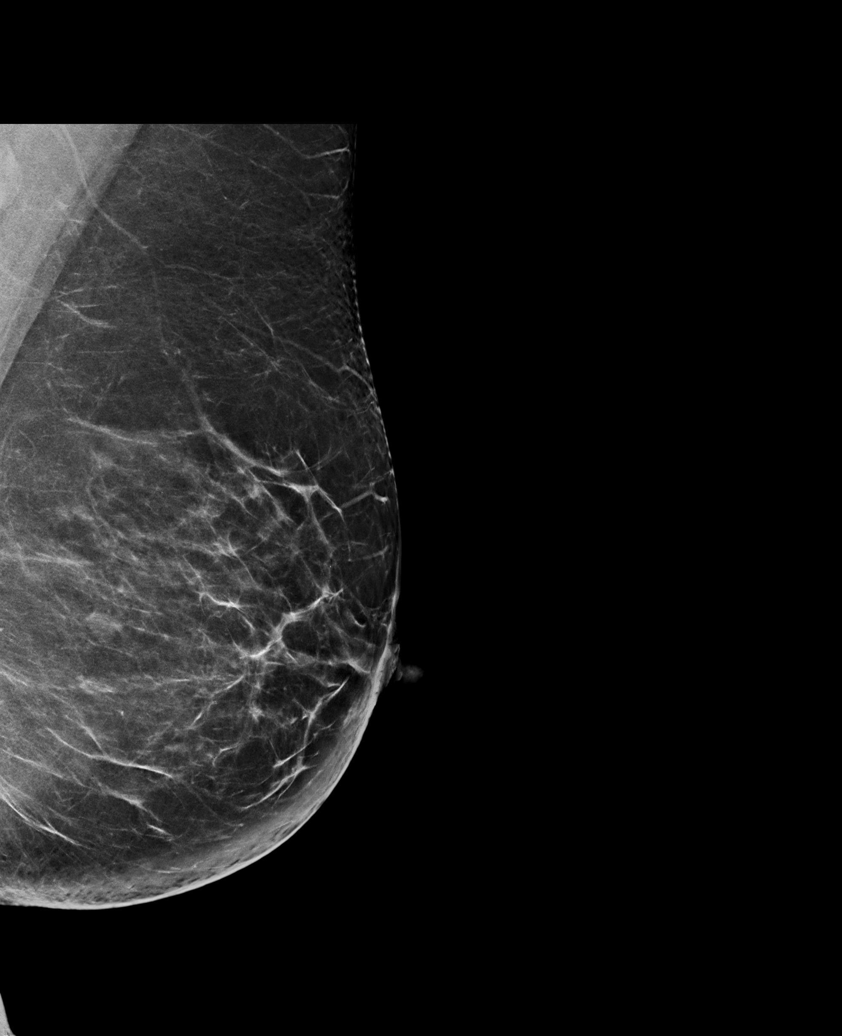

[R MLO synth-2D (1 of 2)]
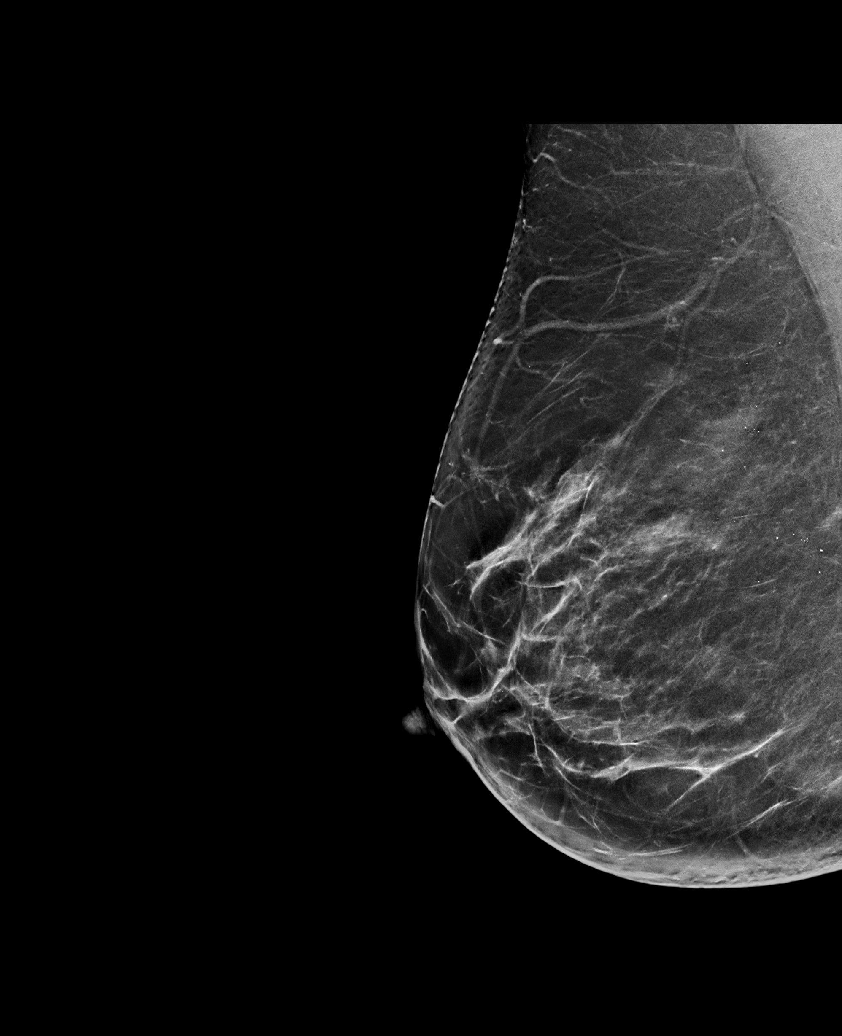

[R MLO synth-2D (2 of 2)]
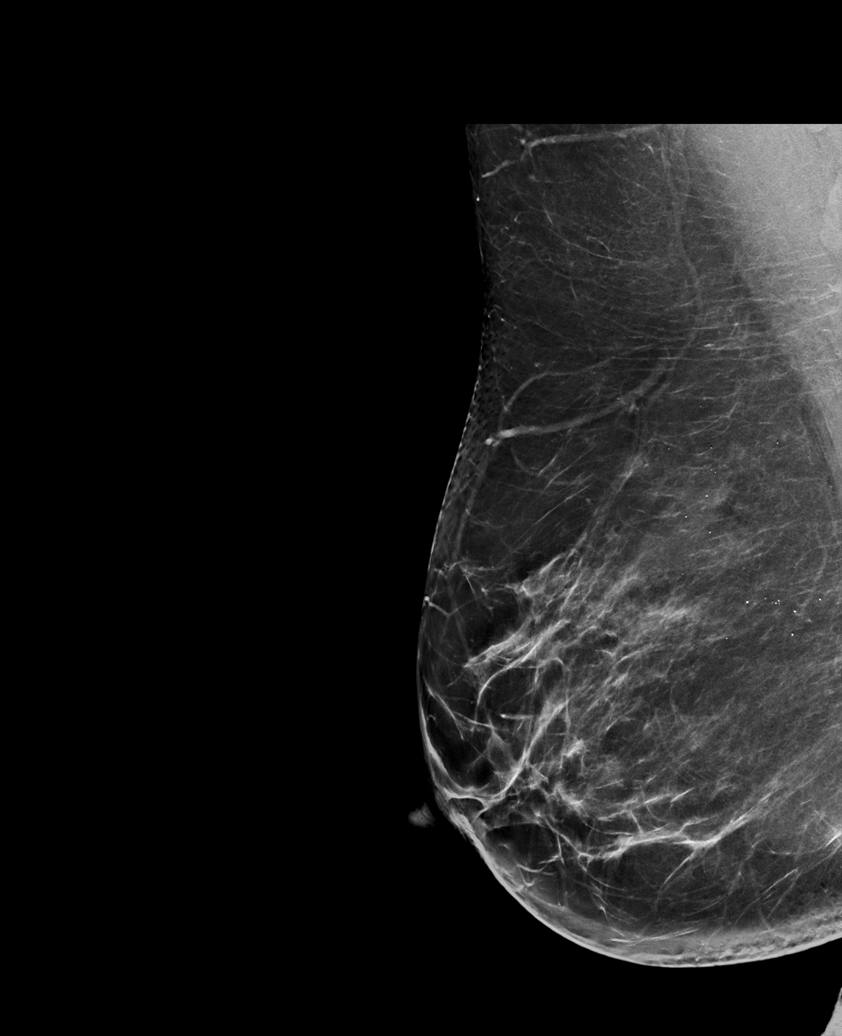

[L CC synth-2D]
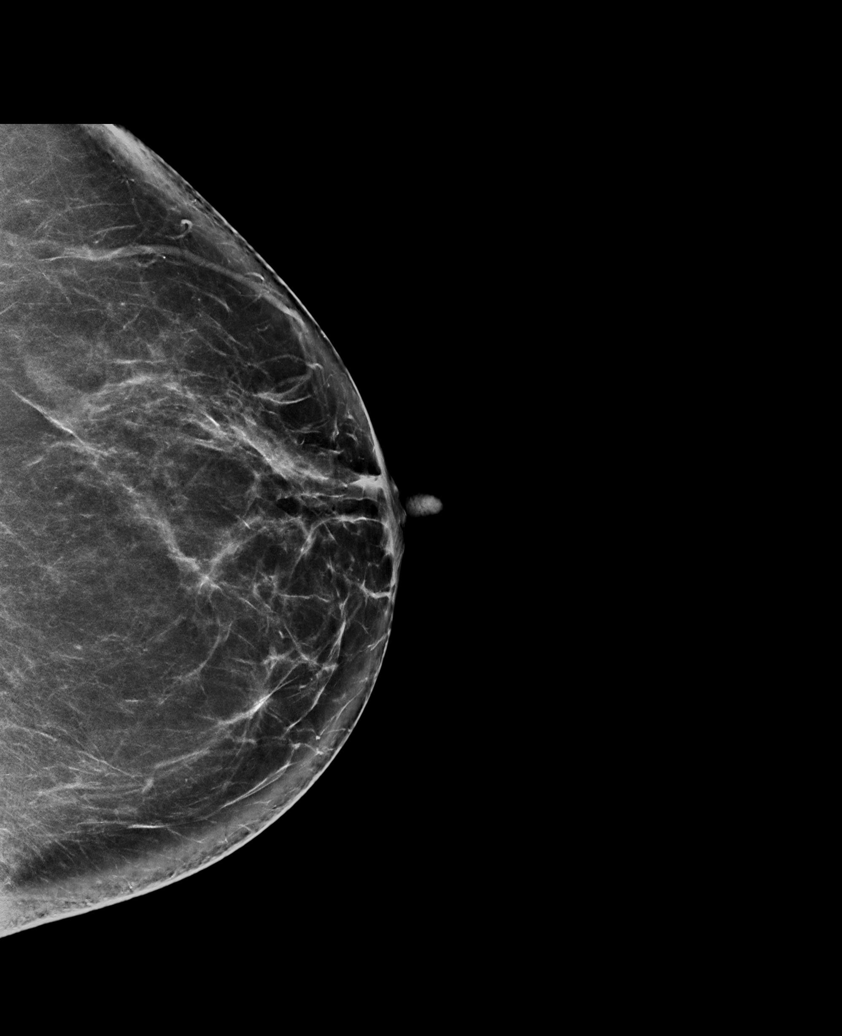

[R CC synth-2D]
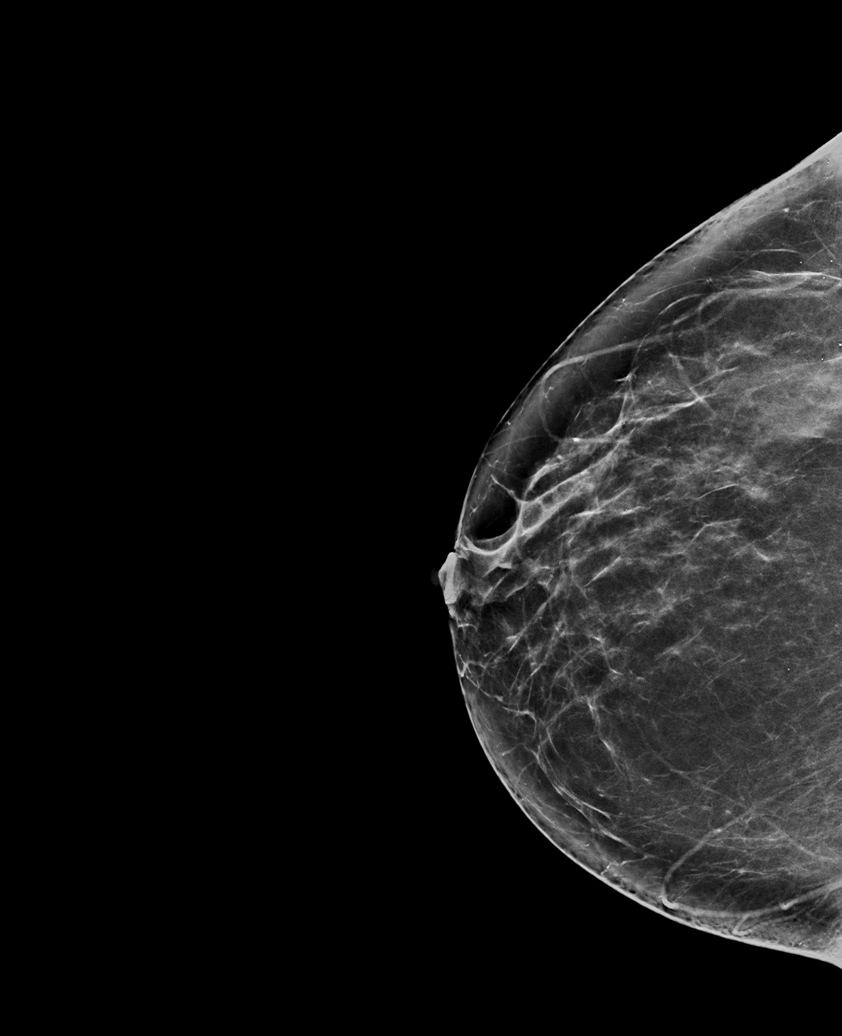

[R CC tomo · tomo slice 39/78.0]
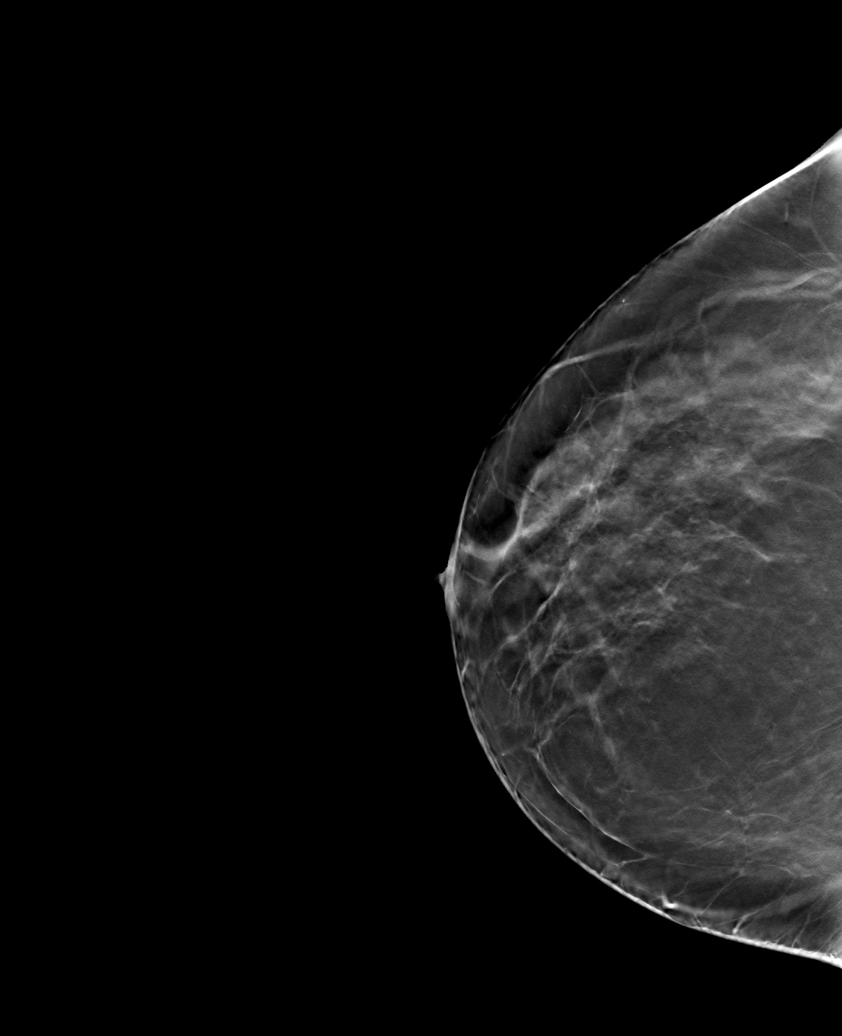

[6 of 30 positions shown; findings below may reference images not displayed]

ACR Breast Density Category b: There are scattered areas of
fibroglandular density.
FINDINGS: There are no findings suspicious for malignancy. Images were
processed with CAD.
IMPRESSION: No mammographic evidence of malignancy. A result letter of this
screening mammogram will be mailed directly to the patient.

RECOMMENDATION:
Screening mammogram in one year. (Code:[id number])

BI-RADS CATEGORY  1: Negative.

## 2019-04-26 ENCOUNTER — Other Ambulatory Visit: Payer: Self-pay

## 2019-04-26 ENCOUNTER — Ambulatory Visit
Admission: EM | Admit: 2019-04-26 | Discharge: 2019-04-26 | Disposition: A | Discharge status: Home / Self Care | Payer: BLUE CROSS/BLUE SHIELD

## 2019-04-26 ENCOUNTER — Ambulatory Visit (INDEPENDENT_AMBULATORY_CARE_PROVIDER_SITE_OTHER): Payer: BLUE CROSS/BLUE SHIELD

## 2019-04-26 ENCOUNTER — Encounter: Payer: Self-pay | Admitting: Emergency Medicine

## 2019-04-26 DIAGNOSIS — M79672 Pain in left foot: Secondary | ICD-10-CM | POA: Diagnosis not present

## 2019-04-26 DIAGNOSIS — W548XXA Other contact with dog, initial encounter: Secondary | ICD-10-CM

## 2019-04-26 DIAGNOSIS — S91115A Laceration without foreign body of left lesser toe(s) without damage to nail, initial encounter: Secondary | ICD-10-CM | POA: Diagnosis not present

## 2019-04-26 MED ORDER — DOXYCYCLINE HYCLATE 100 MG PO TABS
100.0000 mg | ORAL_TABLET | Freq: Once | ORAL | Status: AC
  Administered 2019-04-26: 21:00:00 100 mg via ORAL

## 2019-04-26 MED ORDER — DOXYCYCLINE HYCLATE 100 MG PO CAPS: 100.0000 mg | ORAL_CAPSULE | Freq: Two times a day (BID) | ORAL | 0 refills | Status: AC

## 2019-04-26 MED ORDER — TETANUS-DIPHTH-ACELL PERTUSSIS 5-2.5-18.5 LF-MCG/0.5 IM SUSP
0.5000 mL | Freq: Once | INTRAMUSCULAR | Status: AC
  Administered 2019-04-26: 0.5 mL via INTRAMUSCULAR

## 2019-04-26 NOTE — ED Provider Notes (Signed)
[place], Misty Aguirre   Name: Misty Aguirre DOB: 21-Aug-1965 MRN: [id number] CSN: [id number] PCP: Hyman Hopes, MD  Arrival date and time:  04/26/19 1941  Chief Complaint:  Extremity Laceration   NOTE: Prior to seeing the patient today, I have reviewed the triage nursing documentation and vital signs. Clinical staff has updated patient's PMH/PSHx, current medication list, and drug allergies/intolerances to ensure comprehensive history available to assist in medical decision making.   History:   HPI: Misty Aguirre is a 54 y.o. female who presents today with complaints of LEFT foot pain and laceration. Approximately 45 minutes prior to arrival, patient was walking her dog and the "harness handle" caught the 5th digit of her LEFT foot resulting in the presenting complaints. Patient presents with bleeding foot wound. She is ambulating with the use of a cane. Patient is very anxious and reports the pain as been significant; rates 7/10. Tetanus vaccination status reviewed with patient. Based on her reports, it is determined that she is not up to date on her tetanus prophylaxis.   Past Medical History:  Diagnosis Date  . Depression   . Thyroid disease     Past Surgical History:  Procedure Laterality Date  . ECTOPIC PREGNANCY SURGERY    . LEFT OOPHORECTOMY    . TUBAL LIGATION    . WISDOM TOOTH EXTRACTION      Family History  Problem Relation Age of Onset  . Cancer Mother 73       breast cancer  . Stroke Mother   . Diabetes Mother   . Hypertension Mother   . Breast cancer Mother   . Diabetes Father   . Hypertension Father   . Pulmonary fibrosis Father   . Diabetes Brother   . Heart disease Brother   . Hypertension Brother   . Early death Brother   . Cancer Maternal Grandmother        Breast Cancer  . Heart disease Maternal Grandmother   . Diabetes Maternal Grandmother   . Heart disease Maternal Grandfather        congestive heart failure  . Diabetes Paternal Grandmother      Social History   Tobacco Use  . Smoking status: Never Smoker  . Smokeless tobacco: Never Used  Substance Use Topics  . Alcohol use: No  . Drug use: No    There are no problems to display for this patient.   Home Medications:    Current Meds  Medication Sig  . aspirin 81 MG EC tablet Take by mouth.  . gabapentin (NEURONTIN) 100 MG capsule Take by mouth.  . [DISCONTINUED] levothyroxine (SYNTHROID, LEVOTHROID) 88 MCG tablet Take 88 mcg by mouth daily.    Allergies:   Penicillins  Review of Systems (ROS): Review of Systems  Constitutional: Negative for chills and fever.  Respiratory: Negative for cough and shortness of breath.   Cardiovascular: Negative for chest pain and palpitations.  Musculoskeletal: Positive for gait problem (2/2 foot wound).  Skin: Positive for wound.  All other systems reviewed and are negative.    Vital Signs: Today's Vitals   04/26/19 1952 04/26/19 1953 04/26/19 1955 04/26/19 2058  Pulse:   (!) 112   Resp:   18   Temp:   98.1 F (36.7 C)   TempSrc:   Oral   SpO2:   100%   Weight:  267 lb (121.1 kg)    Height:  5\' 10"  (1.778 m)    PainSc: 7    7  Physical Exam: Physical Exam  Constitutional: She is oriented to person, place, and time and well-developed, well-nourished, and in no distress.  HENT:  Head: Normocephalic and atraumatic.  Eyes: Pupils are equal, round, and reactive to light.  Cardiovascular: Normal rate and intact distal pulses.  Pulmonary/Chest: Effort normal. No respiratory distress.  Musculoskeletal:     Left foot: Normal capillary refill. Laceration (marked location) and tenderness (marked location) present. No swelling or deformity.       Feet:  Neurological: She is alert and oriented to person, place, and time. Gait normal.  Skin: Skin is warm and dry. No rash noted. She is not diaphoretic.  Psychiatric: Mood, memory, affect and judgment normal.  Nursing note and vitals reviewed.   Urgent Care Treatments  / Results:   Orders Placed This Encounter  Procedures  . ED LACERATION REPAIR  . DG Foot 2 Views Left  . Buddy tape toes  . Apply dressing    LABS: PLEASE NOTE: all labs that were ordered this encounter are listed, however only abnormal results are displayed. Labs Reviewed - No data to display  EKG: -None  RADIOLOGY: DG Foot 2 Views Left  Result Date: 04/26/2019 CLINICAL DATA:  54 year old female status post fall with left 5th toe laceration and lateral foot pain. EXAM: LEFT FOOT - 2 VIEW COMPARISON:  None. FINDINGS: Bone mineralization is within normal limits for age. Calcaneus degenerative spurring. Tarsal bones appear intact. No metatarsal fracture identified. Phalanges also appear intact. MTP and distal joint spaces are normally aligned. 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 No discrete soft tissue injury. IMPRESSION: No acute fracture or dislocation identified about the left foot. Electronically Signed   By: Odessa Fleming M.D.   On: 04/26/2019 20:55    PROCEDURES: Laceration Repair Performed by: Verlee Monte, NP Authorized by: Verlee Monte, NP   Consent:    Consent obtained:  Verbal   Consent given by:  Patient   Risks discussed:  Infection, need for additional repair, pain, poor cosmetic result, poor wound healing, retained foreign body, nerve damage, tendon damage and vascular damage   Alternatives discussed:  No treatment, delayed treatment and referral Universal protocol:    Procedure explained and questions answered to patient or proxy's satisfaction: yes     Imaging studies available: yes     Patient identity confirmed:  Verbally with patient Anesthesia (see MAR for exact dosages):    Anesthesia method:  Local infiltration   Local anesthetic:  Lidocaine 1% w/o epi Laceration details:    Location: 5th digit of LEFT foot (pinky toe)   Length (cm):  2 (Approximately 2cm; diffulcult to measure due to location) Repair type:    Repair type:  Intermediate Pre-procedure  details:    Preparation:  Patient was prepped and draped in usual sterile fashion and imaging obtained to evaluate for foreign bodies Exploration:    Hemostasis achieved with:  Direct pressure   Wound exploration: wound explored through full range of motion and entire depth of wound probed and visualized     Wound extent: no foreign bodies/material noted, no muscle damage noted, no nerve damage noted, no tendon damage noted, no underlying fracture noted and no vascular damage noted     Contaminated: yes   Treatment:    Area cleansed with:  Betadine, Hibiclens and saline   Amount of cleaning:  Extensive   Irrigation solution:  Sterile saline   Irrigation method:  Pressure wash and syringe   Visualized foreign bodies/material removed: no  Skin repair:    Repair method:  Sutures   Suture size:  4-0   Suture material:  Nylon   Suture technique:  Simple interrupted   Number of sutures:  7 Approximation:    Approximation:  Close Post-procedure details:    Dressing:  Non-adherent dressing and splint for protection   Patient tolerance of procedure:  Tolerated well, no immediate complications    MEDICATIONS RECEIVED THIS VISIT: Medications  Tdap (BOOSTRIX) injection 0.5 mL (0.5 mLs Intramuscular Given 04/26/19 2027)  doxycycline (VIBRA-TABS) tablet 100 mg (100 mg Oral Given 04/26/19 2055)    PERTINENT CLINICAL COURSE NOTES/UPDATES:   Initial Impression / Assessment and Plan / Urgent Care Course:  Pertinent labs & imaging results that were available during my care of the patient were personally reviewed by me and considered in my medical decision making (see lab/imaging section of note for values and interpretations).  TERISA BELARDO is a 54 y.o. female who presents to [facility] today with complaints of Extremity Laceration   Patient is well appearing overall in clinic today. She does not appear to be in any acute distress. Presenting symptoms (see HPI) and exam as documented  above. Diagnostic radiographs of the LEFT foot revealed no acute abnormalities; no fracture, dislocation, or effusion. TDAP updated. Laceration repaired as per above procedure note. Patient has cane and crutches at home. Encouraged to limit weight bearing over the next 48-72 hours to allow for approximation and healing. Wound cleansed and dressing by clinic nursing staff. 4th and 5th digits buddy taped to help stabilize sutured wound. Patient educated on need for daily wound care. She was encouraged to keep wound clean and dry.    Patient to monitor for signs and symptoms of infection, which would include increased redness, swelling, streaking, drainage, pain, and the development of a fever.   Due to location of her wound, will cover with 7 day course of oral doxycycline; first dose given in clinic.    Patient to RTC in 10 days to have sutures removed.   May use Tylenol and/or Ibuprofen as needed for pain.   Discussed follow up with primary care physician in 1 week for re-evaluation. I have reviewed the follow up and strict return precautions for any new or worsening symptoms. Patient is aware of symptoms that would be deemed urgent/emergent, and would thus require further evaluation either here or in the emergency department. At the time of discharge, she verbalized understanding and consent with the discharge plan as it was reviewed with her. All questions were fielded by provider and/or clinic staff prior to patient discharge.    Final Clinical Impressions / Urgent Care Diagnoses:   Final diagnoses:  Laceration of fifth toe of left foot, initial encounter  Foot pain, left    New Prescriptions:  [place] Controlled Substance Registry consulted? Not Applicable  Meds ordered this encounter  Medications  . Tdap (BOOSTRIX) injection 0.5 mL  . doxycycline (VIBRAMYCIN) 100 MG capsule    Sig: Take 1 capsule (100 mg total) by mouth 2 (two) times daily.    Dispense:  14 capsule    Refill:  0  .  doxycycline (VIBRA-TABS) tablet 100 mg    Recommended Follow up Care:  Patient encouraged to follow up with the following provider within the specified time frame, or sooner as dictated by the severity of her symptoms. As always, she was instructed that for any urgent/emergent care needs, she should seek care either here or in the emergency department  for more immediate evaluation.  Follow-up Information    [place] In 10 days.   Specialty: Urgent Care Contact information: [address] [place] [id number] [phone]        NOTE: This note was prepared using Dragon dictation software along with smaller phrase technology. Despite my best ability to proofread, there is the potential that transcriptional errors may still occur from this process, and are completely unintentional.    Karen Kitchens, NP 04/27/19 1524

## 2019-04-26 NOTE — Discharge Instructions (Addendum)
It was very nice seeing you today in clinic. Thank you for entrusting me with your care.   Rest and use crutches that you already for the next few days. Avoid weight bearing for at least the next 2-3 days. Keep wound clean and dry. Take antibiotics as prescribed. Monitor for signs and symptoms of infection, which would include increased redness, swelling, streaking, drainage, pain, and the development of a fever. May use Tylenol and/or Ibuprofen as needed for pain.  Return to clinic in 10 DAYS to have stitches removed. If your symptoms/condition worsens, please seek follow up care either here or in the ER. Please remember, our [facility] providers are "right here with you" when you need [place].   Again, it was my pleasure to take care of you today. Thank you for choosing our clinic. I hope that you start to feel better quickly.   Quentin Mulling, MSN, APRN, FNP-C, CEN Advanced Practice Provider [facility] [place] [facility]

## 2019-04-26 NOTE — ED Triage Notes (Signed)
Patient states she caught her left pinky toe on her dog harness handle about 45 minutes ago. She has a laceration to the bottom of her pinky toe.

## 2019-05-07 ENCOUNTER — Ambulatory Visit
Admission: EM | Admit: 2019-05-07 | Discharge: 2019-05-07 | Disposition: A | Discharge status: Home / Self Care | Payer: BLUE CROSS/BLUE SHIELD

## 2019-05-07 DIAGNOSIS — Z4802 Encounter for removal of sutures: Secondary | ICD-10-CM

## 2019-05-07 NOTE — ED Triage Notes (Signed)
Pt seen here for sutures on 1/14. Left foot 7 sutures. Still having a lot of pain.

## 2019-05-07 NOTE — ED Notes (Signed)
Wound healing without sx infection. Still tender when moving toe to the side. Discussed ongoing healing will take some time due to area of injury. Follow up as needed

## 2020-05-14 IMAGING — CR DG FOOT 2V*L*
2 series · 2 of 2 positions shown · non-contrast
Comparison: None.

CLINICAL DATA: 53-year-old female status post fall with left 5th
toe laceration and lateral foot pain.

EXAM:
LEFT FOOT - 2 VIEW

[foot ap]
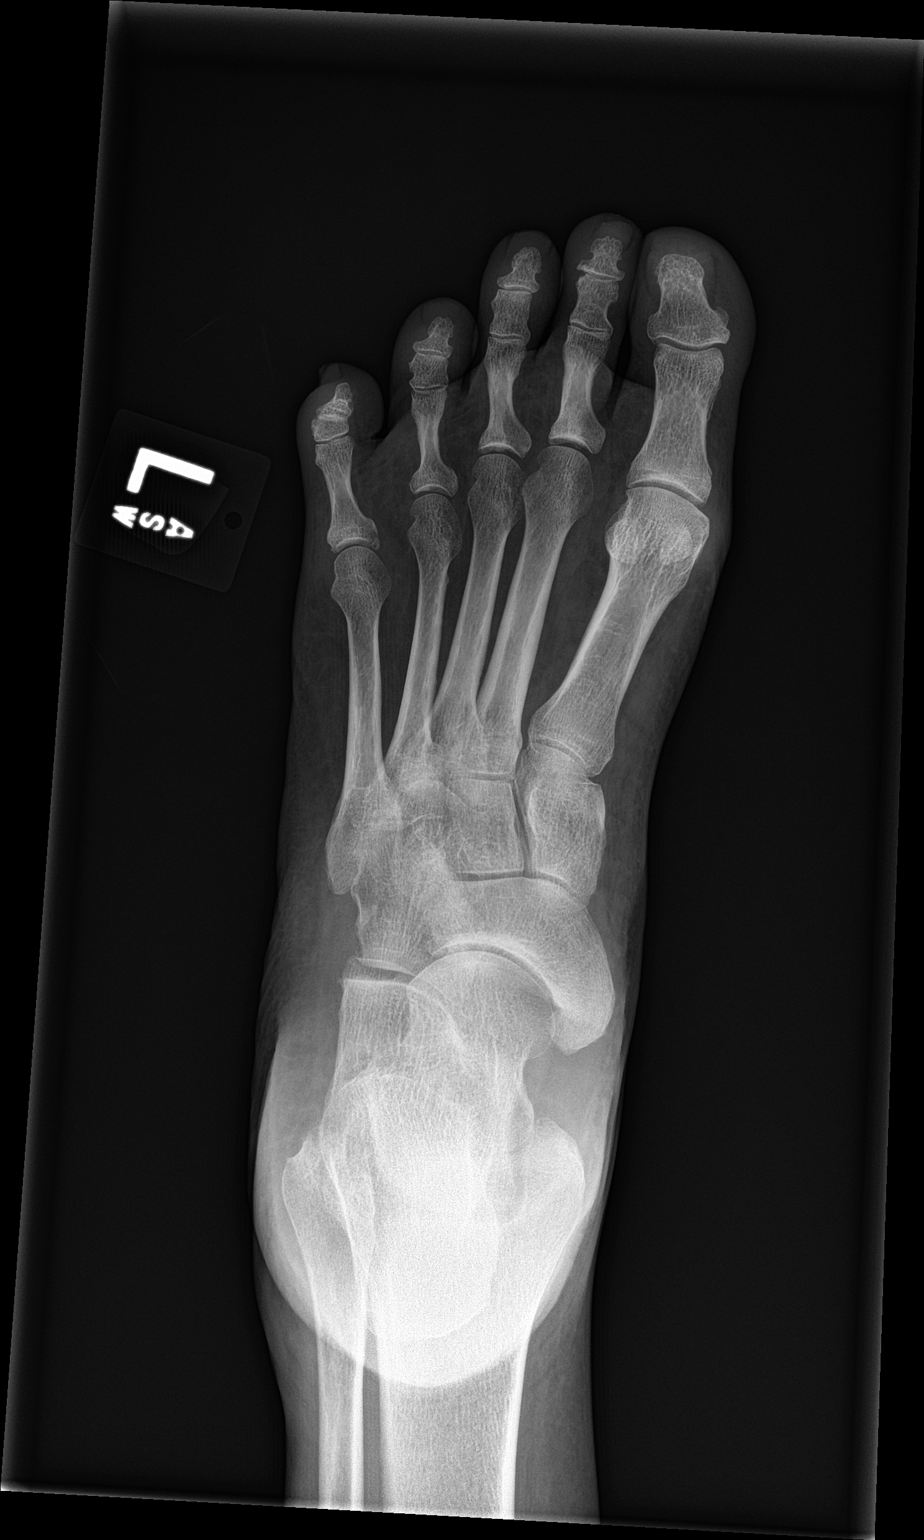

[foot lat]
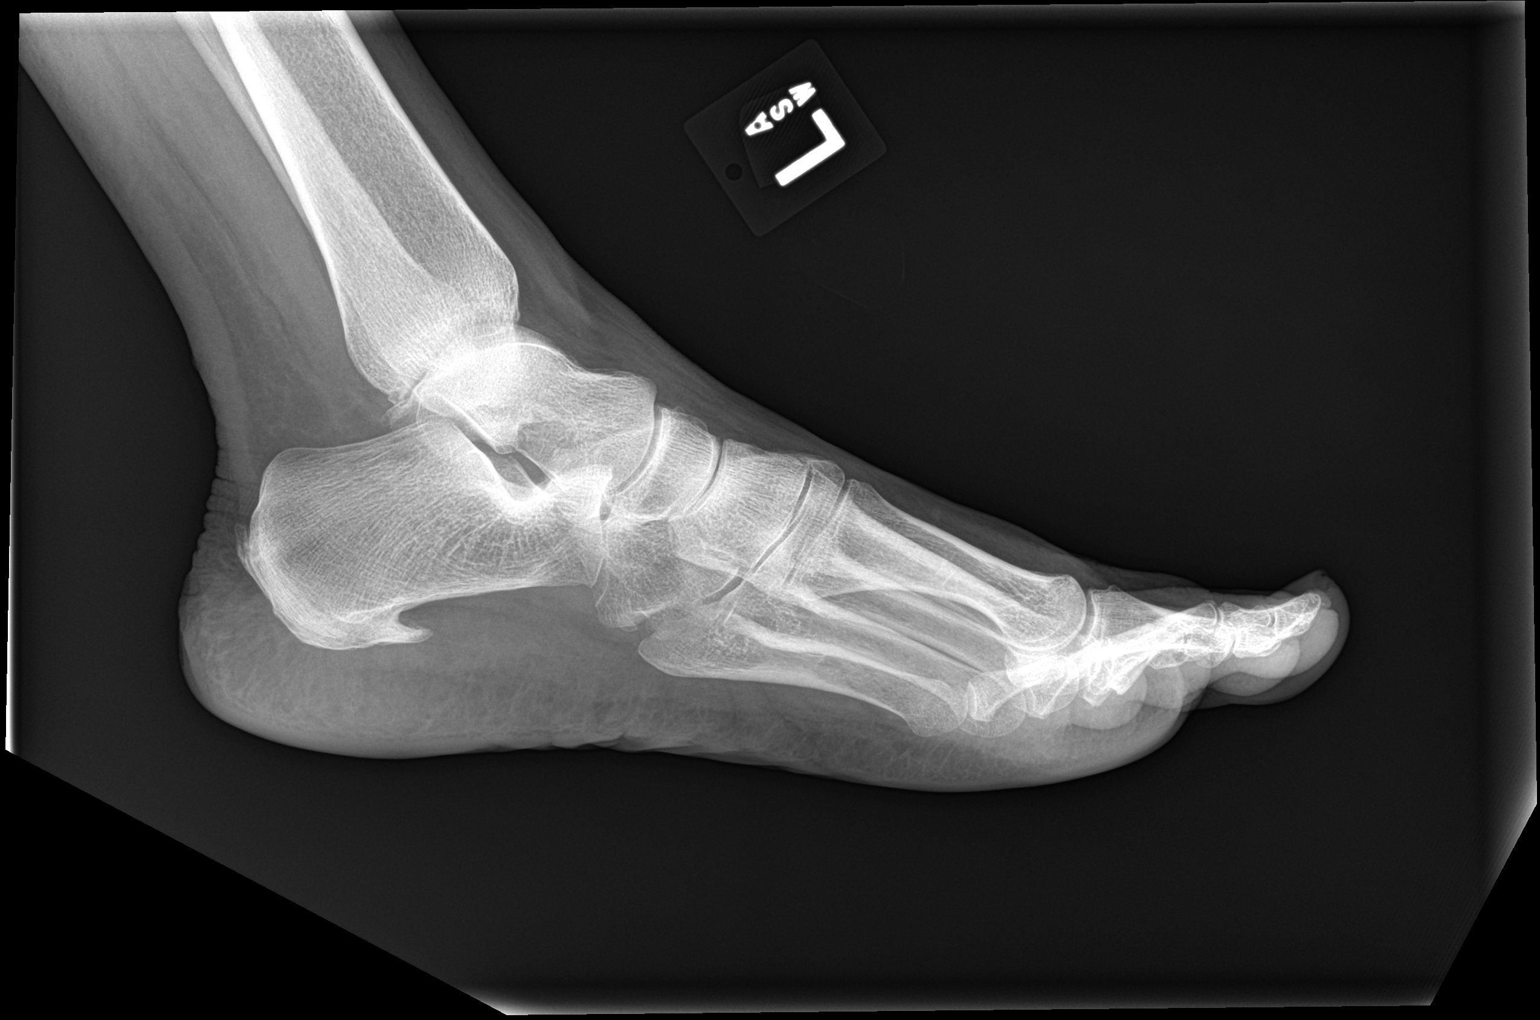

[2 of 2 positions shown; findings below may reference images not displayed]

FINDINGS: Bone mineralization is within normal limits for age. Calcaneus
degenerative spurring. Tarsal bones appear intact. No metatarsal
fracture identified. Phalanges also appear intact. MTP and distal
joint spaces are normally aligned. There is mild 1st MTP
osteoarthritis. No discrete soft tissue injury.
IMPRESSION: No acute fracture or dislocation identified about the left foot.

## 2021-10-29 ENCOUNTER — Other Ambulatory Visit: Payer: Self-pay

## 2021-10-29 DIAGNOSIS — Z1231 Encounter for screening mammogram for malignant neoplasm of breast: Secondary | ICD-10-CM

## 2021-11-03 ENCOUNTER — Ambulatory Visit: Payer: BLUE CROSS/BLUE SHIELD | Attending: Hematology and Oncology | Admitting: *Deleted

## 2021-11-03 ENCOUNTER — Ambulatory Visit
Admission: RE | Admit: 2021-11-03 | Discharge: 2021-11-03 | Disposition: A | Discharge status: Home / Self Care | Payer: BLUE CROSS/BLUE SHIELD | Source: Ambulatory Visit | Attending: Obstetrics and Gynecology | Admitting: Obstetrics and Gynecology

## 2021-11-03 VITALS — BP 163/87 | Wt 249.3 lb

## 2021-11-03 DIAGNOSIS — Z1231 Encounter for screening mammogram for malignant neoplasm of breast: Secondary | ICD-10-CM | POA: Insufficient documentation

## 2021-11-03 DIAGNOSIS — Z01419 Encounter for gynecological examination (general) (routine) without abnormal findings: Secondary | ICD-10-CM

## 2021-11-03 NOTE — Progress Notes (Addendum)
Misty Aguirre is a 56 y.o. female who presents to [facility] clinic today with no complaints. She presents for clinical breast exam and mammogram.    Pap Smear: Pap not smear completed today. Last Pap smear was last week at the [facility] clinic per patient.  Results are pending . Per patient has history of an abnormal Pap smear of HPV negative /negative pap on 03/15/19. Last Pap smear result is not available in Epic.   Physical exam: Breasts Physical Exam Chest:  Breasts:    Right: No swelling, bleeding, inverted nipple, mass, nipple discharge, skin change or tenderness.     Left: No swelling, bleeding, inverted nipple, mass, nipple discharge, skin change or tenderness.     Lymphadenopathy:     Upper Body:     Right upper body: No supraclavicular or axillary adenopathy.     Left upper body: No supraclavicular or axillary adenopathy.   Pelvic/Bimanual Pap is not indicated today    Smoking History: Patient has never smoked    Patient Navigation: Patient education provided. Taught self breast awareness.  Access to services provided for patient through [facility] program. No interpreter provided. No transportation provided   Colorectal Cancer Screening: Per patient has never had colonoscopy completed. Patient states she was given a FIT test last week at the [facility].  No complaints today.    Breast and Cervical Cancer Risk Assessment: Patient has family history of breast cancer in her mother, and 2 maternal aunts, no known genetic mutations, or radiation treatment to the chest before age 60. Patient does not have history of cervical dysplasia, immunocompromised, or DES exposure in-utero.  Encouraged annual screening.    Risk Assessment     Risk Scores       11/03/2021 09/26/2017   Last edited by: Narda Rutherford, LPN Scarlett Presto, RN   5-year risk: 2.5 % 1.5 %   Lifetime risk: 15.6 % 11.8 %            A: [facility] exam without pap smear P: Referred patient to the  [facility] for a screening mammogram. Appointment scheduled today.  Misty Like, RN 11/03/2021 10:15 AM

## 2021-11-03 NOTE — Progress Notes (Deleted)
  Subjective:     Patient ID: Misty Aguirre, female   DOB: 27-Jun-1965, 56 y.o.   MRN: [id number]  HPI   Review of Systems     Objective:   Physical Exam Chest:  Breasts:    Right: No swelling, bleeding, inverted nipple, mass, nipple discharge, skin change or tenderness.     Left: No swelling, bleeding, inverted nipple, mass, nipple discharge, skin change or tenderness.    Lymphadenopathy:     Upper Body:     Right upper body: No supraclavicular or axillary adenopathy.     Left upper body: No supraclavicular or axillary adenopathy.      Assessment:     ***    Plan:     ***

## 2021-12-16 ENCOUNTER — Encounter: Payer: Self-pay | Admitting: Family Medicine
